# Patient Record
Sex: Female | Born: 1975 | Race: Black or African American | Hispanic: No | Marital: Married | Smoking: Never smoker
Health system: Southern US, Community
[De-identification: ages and names within clinical notes are randomized; demographics above are authoritative.]

## PROBLEM LIST (undated history)

## (undated) ENCOUNTER — Inpatient Hospital Stay (HOSPITAL_COMMUNITY): Payer: Self-pay

## (undated) DIAGNOSIS — D219 Benign neoplasm of connective and other soft tissue, unspecified: Secondary | ICD-10-CM

## (undated) HISTORY — PX: CHOLECYSTECTOMY: SHX55

## (undated) HISTORY — PX: LAPAROSCOPY: SHX197

## (undated) HISTORY — PX: OTHER SURGICAL HISTORY: SHX169

---

## 2000-06-16 ENCOUNTER — Other Ambulatory Visit: Admission: RE | Admit: 2000-06-16 | Discharge: 2000-06-16 | Payer: Self-pay | Admitting: *Deleted

## 2000-12-28 ENCOUNTER — Inpatient Hospital Stay (HOSPITAL_COMMUNITY): Admission: AD | Admit: 2000-12-28 | Discharge: 2000-12-31 | Payer: Self-pay | Admitting: Obstetrics and Gynecology

## 2001-01-01 ENCOUNTER — Encounter: Admission: RE | Admit: 2001-01-01 | Discharge: 2001-01-31 | Payer: Self-pay | Admitting: Obstetrics and Gynecology

## 2004-06-15 ENCOUNTER — Emergency Department (HOSPITAL_COMMUNITY): Admission: EM | Admit: 2004-06-15 | Discharge: 2004-06-16 | Payer: Self-pay | Admitting: Emergency Medicine

## 2004-06-24 ENCOUNTER — Ambulatory Visit (HOSPITAL_COMMUNITY): Admission: RE | Admit: 2004-06-24 | Discharge: 2004-06-24 | Payer: Self-pay

## 2004-06-24 ENCOUNTER — Encounter (INDEPENDENT_AMBULATORY_CARE_PROVIDER_SITE_OTHER): Payer: Self-pay | Admitting: Specialist

## 2004-06-30 ENCOUNTER — Inpatient Hospital Stay (HOSPITAL_COMMUNITY): Admission: EM | Admit: 2004-06-30 | Discharge: 2004-07-07 | Payer: Self-pay | Admitting: Emergency Medicine

## 2004-07-08 ENCOUNTER — Inpatient Hospital Stay (HOSPITAL_COMMUNITY): Admission: EM | Admit: 2004-07-08 | Discharge: 2004-07-11 | Payer: Self-pay | Admitting: Emergency Medicine

## 2004-08-06 ENCOUNTER — Ambulatory Visit (HOSPITAL_COMMUNITY): Admission: RE | Admit: 2004-08-06 | Discharge: 2004-08-06 | Payer: Self-pay | Admitting: Gastroenterology

## 2005-02-11 ENCOUNTER — Other Ambulatory Visit: Admission: RE | Admit: 2005-02-11 | Discharge: 2005-02-11 | Payer: Self-pay | Admitting: Obstetrics and Gynecology

## 2005-02-15 IMAGING — CT CT ABDOMEN W/ CM
1 of 3 series · 14 of 32 positions shown, 19 images · IV contrast (omnipaque)
Comparison: 07/04/2004 and 07/08/2004

ABDOMEN CT WITH CONTRAST

CLINICAL DATA: History of cholecystectomy with subsequent biloma. Status post
drainage. Drainage catheter has decreased output.
TECHNIQUE: Multidetector CT imaging of the abdomen and pelvis was performed
following the standard protocol during bolus administration of intravenous
contrast.

Contrast:  100 cc Omnipaque 300

[Series 5: — · axial · 0.57mm/px · z∈[-620,-216]mm · 14 of 91 slices shown, 19 images]
[im 5/91  soft-tissue]
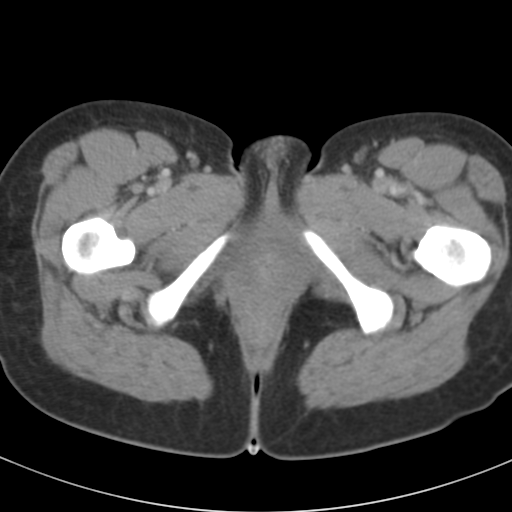
[im 5/91  bone]
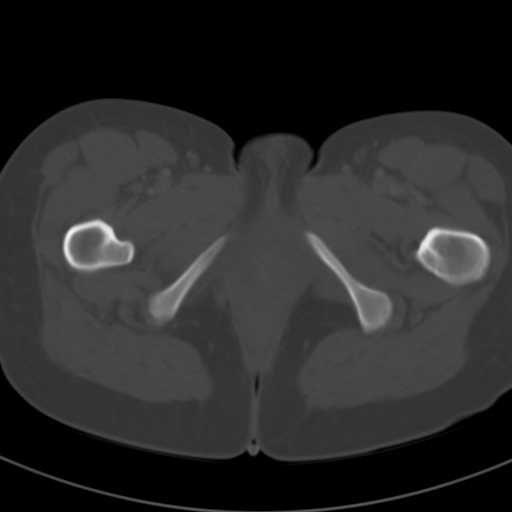
[im 14/91  soft-tissue]
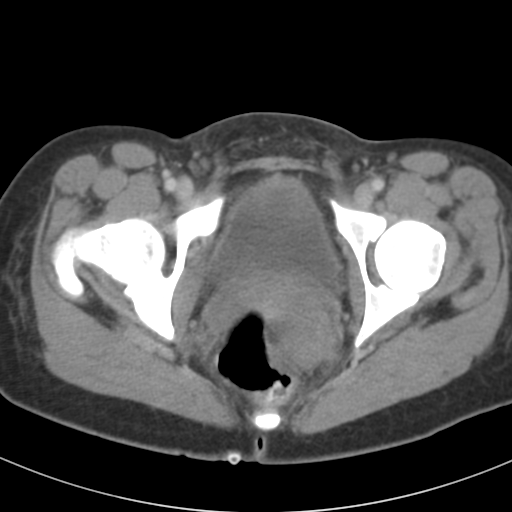
[im 19/91  soft-tissue]
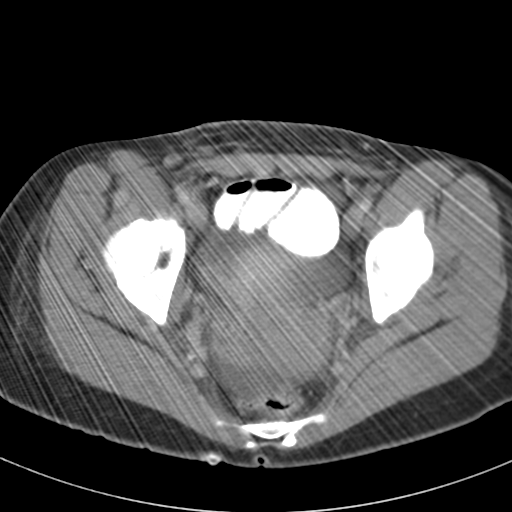
[im 28/91  soft-tissue]
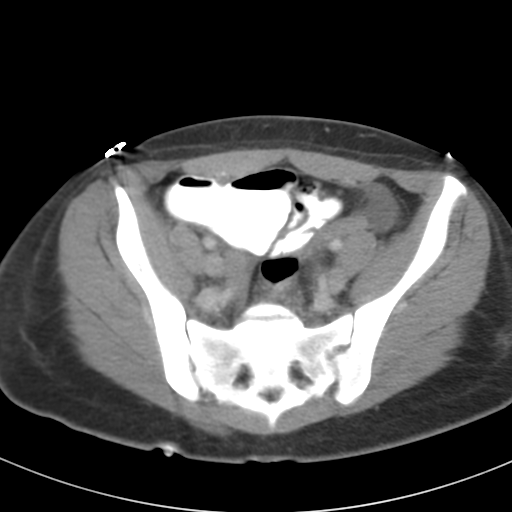
[im 32/91  soft-tissue]
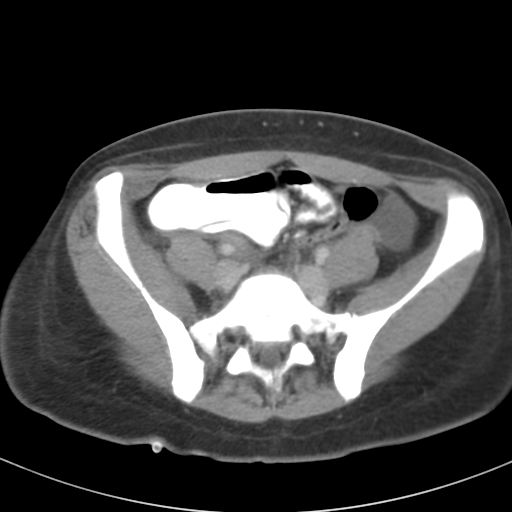
[im 41/91  soft-tissue]
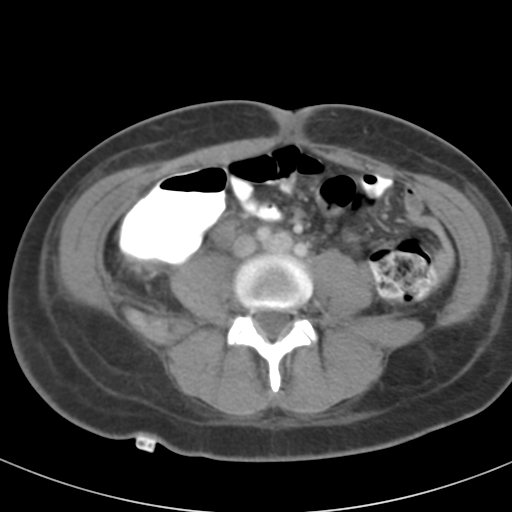
[im 46/91  soft-tissue]
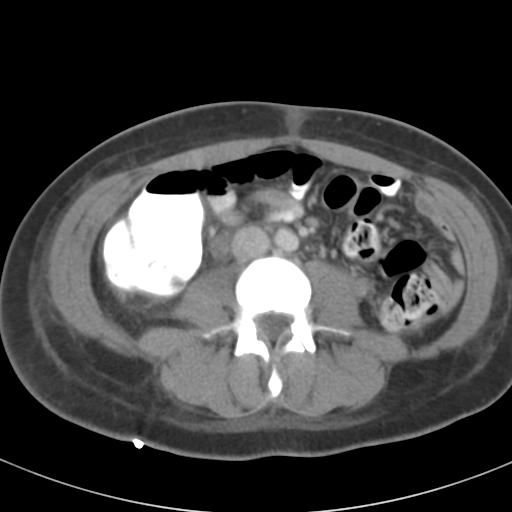
[im 50/91  soft-tissue]
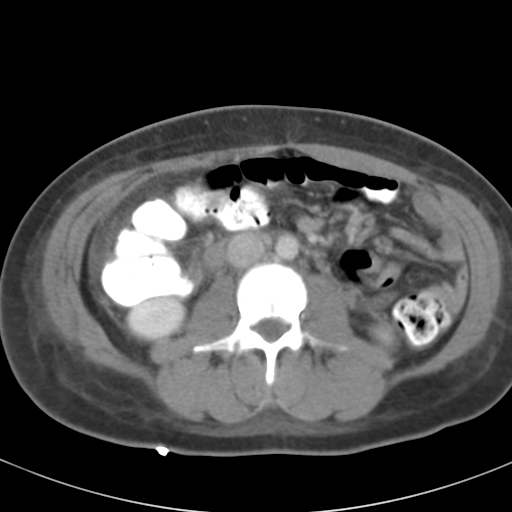
[im 59/91  soft-tissue]
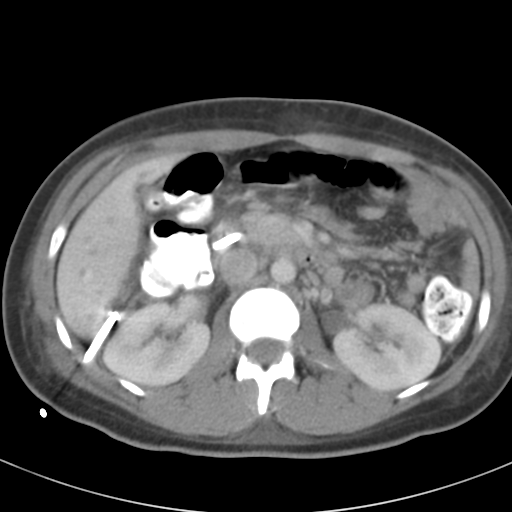
[im 59/91  bone]
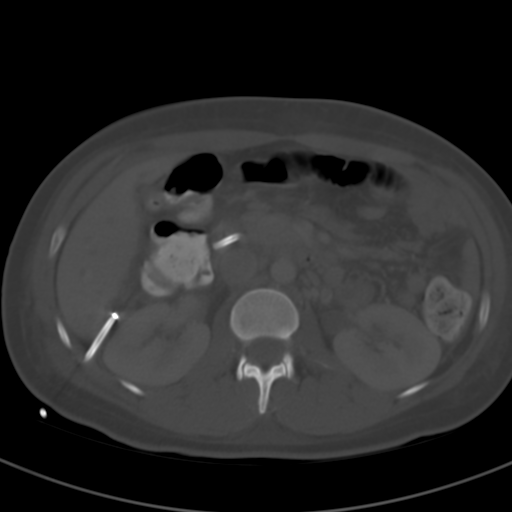
[im 64/91  soft-tissue]
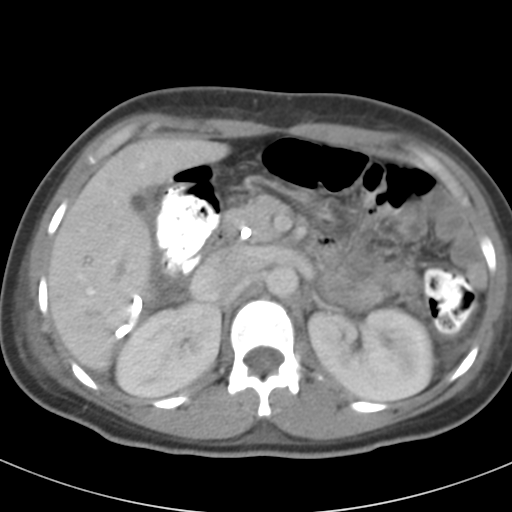
[im 73/91  soft-tissue]
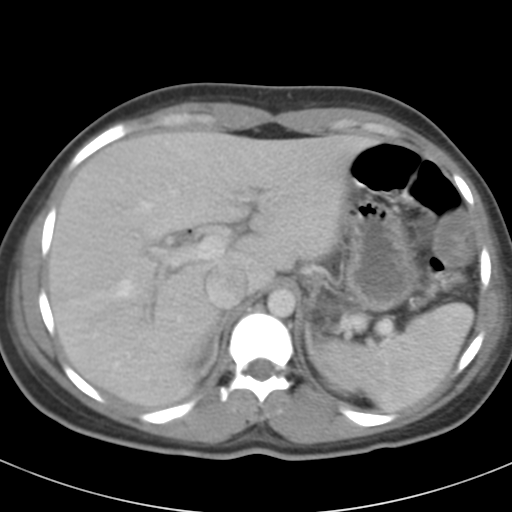
[im 73/91  lung]
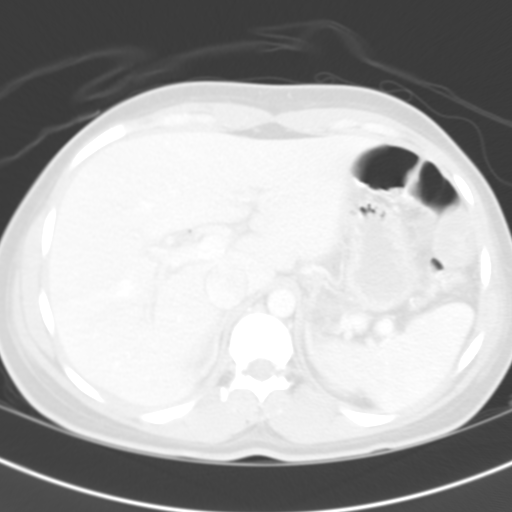
[im 77/91  soft-tissue]
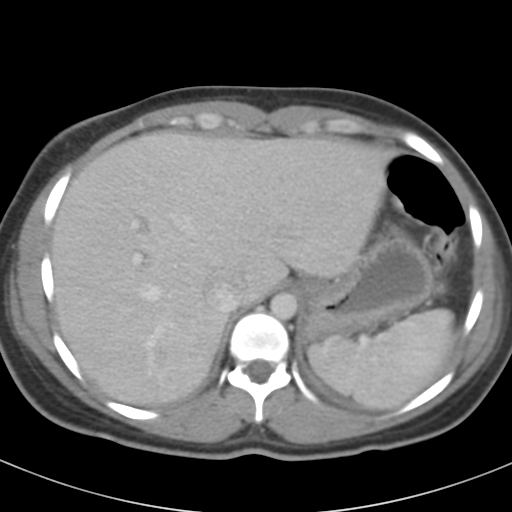
[im 77/91  lung]
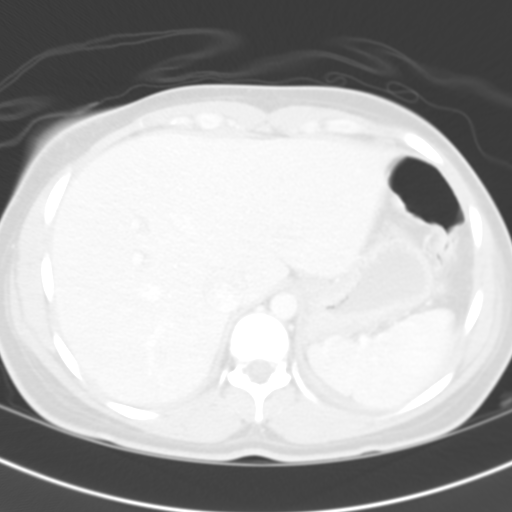
[im 82/91  lung]
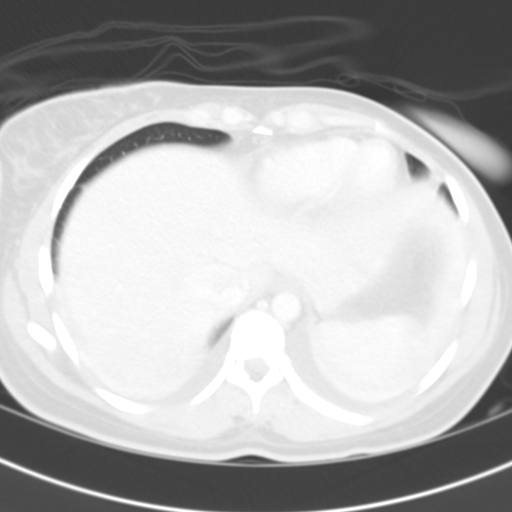
[im 86/91  soft-tissue]
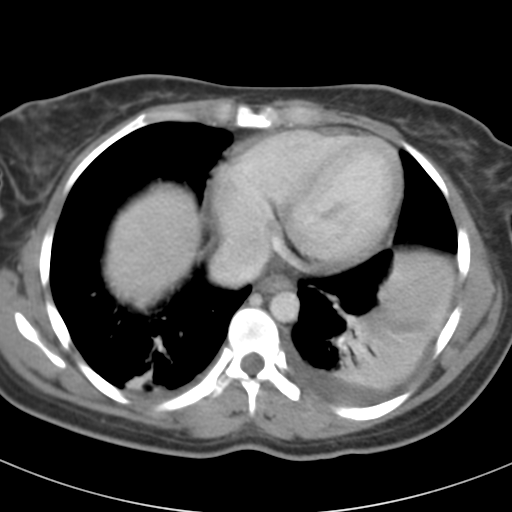
[im 86/91  lung]
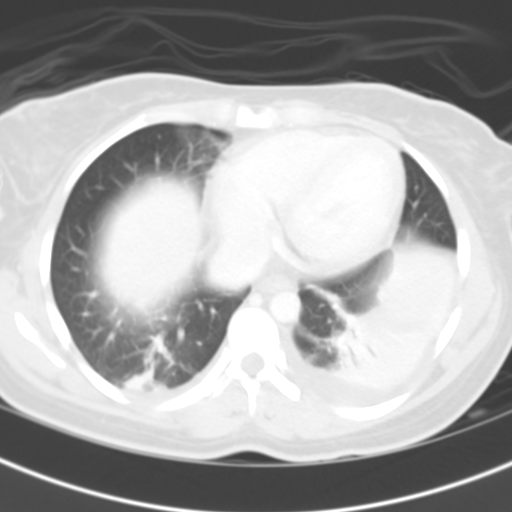

[14 of 32 positions shown; findings below may reference images not displayed]

FINDINGS: Right upper quadrant drain remains in place. Minimal fluid is present
around the drain currently. The fluid in the gallbladder fossa and Morrison's
pouch has decreased slightly since prior study.

There is mild periportal edema within the liver. Endoscopically placed common
bile duct stent again noted. There are small bilateral effusions with bibasilar
atelectasis. Solid organs are unremarkable.

IMPRESSION

Continued decrease in fluid within the right upper moderate. Very little fluid
remains around the right upper quadrant drain currently. .

PELVIS CT WITH CONTRAST
FINDINGS: Small amount of free fluid is noted in the pelvis, decreased slightly
since prior study. Uterus and adnexa unremarkable. Bowel grossly unremarkable.

IMPRESSION

Small amount of free fluid, decreased since prior study.

## 2005-02-17 ENCOUNTER — Inpatient Hospital Stay (HOSPITAL_COMMUNITY): Admission: AD | Admit: 2005-02-17 | Discharge: 2005-02-17 | Payer: Self-pay | Admitting: Obstetrics and Gynecology

## 2005-07-17 ENCOUNTER — Encounter (INDEPENDENT_AMBULATORY_CARE_PROVIDER_SITE_OTHER): Payer: Self-pay | Admitting: Specialist

## 2005-07-17 ENCOUNTER — Inpatient Hospital Stay (HOSPITAL_COMMUNITY): Admission: RE | Admit: 2005-07-17 | Discharge: 2005-07-20 | Payer: Self-pay | Admitting: Obstetrics and Gynecology

## 2005-11-28 ENCOUNTER — Emergency Department (HOSPITAL_COMMUNITY): Admission: EM | Admit: 2005-11-28 | Discharge: 2005-11-28 | Payer: Self-pay | Admitting: Family Medicine

## 2007-03-05 ENCOUNTER — Emergency Department (HOSPITAL_COMMUNITY): Admission: EM | Admit: 2007-03-05 | Discharge: 2007-03-05 | Payer: Self-pay | Admitting: Emergency Medicine

## 2010-12-06 NOTE — Op Note (Signed)
NAME:  Karina Long, Karina Long NO.:  [id number]   MEDICAL RECORD NO.:  [id number]          PATIENT TYPE:  INP   LOCATION:  0444                         FACILITY:  [facility]   PHYSICIAN:  Jordan Hawks. Elnoria Howard, MD    DATE OF BIRTH:  01-10-1976   DATE OF PROCEDURE:  07/01/2004  DATE OF DISCHARGE:                                 OPERATIVE REPORT   REFERRING PHYSICIAN:  Dr. Avel Peace   PROCEDURE:  ERCP.   INDICATION:  For biliary leak.   CONSENT:  Informed consent was obtained from the patient describing the  risks of bleeding, infection, perforation, medication reactions, a 7% chance  for pancreatitis, and the risk of death, all of which are not exclusive of  any other complications that may occur.   PHYSICAL EXAMINATION:  CARDIAC:  Regular rate, rhythm.  LUNGS:  Clear to auscultation bilaterally.  ABDOMEN:  Soft, flat, with right upper quadrant tenderness.   MEDICATIONS:  1.  Versed 5 mg.  2.  Demerol 60 mg.   DESCRIPTION OF PROCEDURE:  After adequate sedation was achieved while the  patient was on the fluoroscopy table, the duodenoscope was advanced from the  oral cavity to the second portion of the duodenum without difficulty under  direct visualization.  After proper positioning of the endoscope, the  papilla was visualized.  There was some evidence of some blood around the  papilla which was very minor, and this was not secondary to trauma.  After  adequate visualization was obtained, a Wilson-Cook triple-lumen  sphincterotome was advanced through the therapeutic channel and was inserted  into the papilla without difficulty.  No contrast was injected into the  pancreatic duct during this procedure.  Once the sphincterotome intubated  the biliary tract, a Jag-wire was inserted which was then advanced to the  left intrahepatic biliary system.  At this time, contrast was injected into  the biliary tract initially distally which was negative for any cystic duct  leak,  and subsequently the sphincterotome was advanced to the bifurcation of  the biliary tree, and additional injection of contrast was performed, and  there was no evidence of any leakage in the gallbladder fossa or through any  accessory ducts.  A total of 30 mL of contrast was used.  Fluoroscopic  images were obtained to confirm this finding.  It was at this time, the  decision was made to perform a biliary stent placement.  The sphincterotome  was exchanged over a guidewire, and a 7 [place] 12 cm biliary stent was  placed over the guidewire and advanced without difficulty.  Once the  guidewire was in place and confirmed fluoroscopically, the guidewire as well  as the endoscope was removed from the patient, and fluoroscopic images were  obtained.  One final note.  There is no evidence of any purulence draining  from the biliary tract after the stent was placed.   PLAN:  1.  Follow clinically.  2.  Remove the stent in 1 month.  3.  Follow up in GI clinic in 1 month's time.      PDH/MEDQ  D:  07/01/2004  T:  07/01/2004  Job:  [id number]   cc:   Adolph Pollack, M.D.  [address]  [place]  [place] [address]  Fax: [phone]   Lorre Munroe., M.D.  Fax: [phone]

## 2010-12-06 NOTE — Discharge Summary (Signed)
NAME:  JOE, TANNEY NO.:  [id number]   MEDICAL RECORD NO.:  [id number]          PATIENT TYPE:  INP   LOCATION:  9108                          FACILITY:  [facility]   PHYSICIAN:  Crist Fat. Rivard, M.D. DATE OF BIRTH:  1976-05-12   DATE OF ADMISSION:  07/17/2005  DATE OF DISCHARGE:  07/20/2005                                 DISCHARGE SUMMARY   ADMITTING DIAGNOSIS:  Intrauterine pregnancy at term with prior C-section,  desires repeat, desires sterilization.   PROCEDURE:  Repeat low transverse cesarean section and bilateral tubal  sterilization.   DISCHARGE DIAGNOSIS:  Intrauterine pregnancy at term delivered, a prior C-  section, desires sterilization, repeat low transverse cesarean section and  bilateral tubal sterilization.   HISTORY OF PRESENT ILLNESS:  Mrs. [name] is a 35 year old gravida 4, para 2-0-  1-2 who presented at term for repeat cesarean delivery and bilateral tubal  sterilization. This was accomplished on 07/17/2005 with surgeon Dr. Marline Backbone. The patient gave birth to a 6 pound 12 ounce female infant named  Nalaya with Apgar scores of 9 at 1 minutes and 9 at 5 minutes. The patient  and infant have done well in the postoperative period. The patient's  hemoglobin on the first postoperative day was 9.1. Her vital signs have been  stable. She is afebrile. Her incision is clean, dry and intact. The baby is  breast-feeding well and on this her third postoperative day, she is judged  to be in satisfactory condition for discharge.   DISCHARGE INSTRUCTIONS:  Per [facility] handout.   DISCHARGE MEDICATIONS:  1.  Motrin 600 milligrams p.o. q.6 h p.r.n. pain  2.  Tylox one to two p.o. q.3-4 hours p.r.n. pain  3.  Prenatal vitamins.   DISCHARGE FOLLOW-UP:  Will be a [facility] in 6 weeks.      Rica Koyanagi, C.N.M.      Crist Fat Rivard, M.D.  Electronically Signed    SDM/MEDQ  D:  07/20/2005  T:  07/21/2005  Job:  [id number]

## 2010-12-06 NOTE — Op Note (Signed)
[facility]  Patient:    Karina Long                        MRN: [id number] Proc. Date: 12/28/00 Adm. Date:  [id number] Attending:  Leonard Schwartz CC:         Juluis Mire, M.D.   Operative Report  PREOPERATIVE DIAGNOSES:       1. Intrauterine pregnancy.                               2. Cord prolapse.  POSTOPERATIVE DIAGNOSES:      1. Intrauterine pregnancy.                               2. Cord prolapse.  OPERATION/PROCEDURE:          Emergency primary low transverse cesarean section.  SURGEON:                      Janine Limbo, M.D.  ASSISTANT:                    Juluis Mire, M.D.  ANESTHESIA:                   Epidural.  INDICATIONS FOR PROCEDURE:    [name] is a 35 year old female, gravida 3 para 1, 0-1-1, who presented to labor and delivery at 39-1/[redacted] weeks gestation (EDC is December 31, 2000).  The patients cervix dilated to 8 cm.  The head was at -1 station.  The patient then had a spontaneous cord prolapse.  The patient was taken to the operating room for emergency cesarean section.  FINDINGS:                     A 6 pound 15 ounce female infant Allyne Gee) was delivered without difficulty.  The Apgars were nine at one minute and nine at five minutes.  The uterus, fallopian tubes, and ovaries were normal for the gravid state.  There was a cord prolapse present.  DESCRIPTION OF PROCEDURE:     The patient was taken to the operating room for an emergency cesarean section.  The patients abdomen was prepped with Betadine, as was the perineum.  A Foley catheter was placed in the bladder. The patient was sterilely draped.  A low transverse incision was made in the abdomen and the incision was carried sharply and bluntly through the fascia, the abdominal musculature, and the anterior peritoneum.  An incision was made in the lower uterine segment and the incision extended transversely.  The fetal head was delivered without  difficulty and the mouth and nose were suctioned.  The remainder of the infant was delivered.  The cord was clamped and cut.  The infant was handed to the pediatric team.  Routine cord blood studies were obtained.  The placenta was removed.  The uterine cavity was cleaned of amniotic fluid, clotted blood, and membranes.  The uterine incision was closed using a running suture of 2-0 Vicryl, followed by figure-of-eight sutures of 2-0 Vicryl for hemostasis.  The pericolonic gutters were cleaned of amniotic fluid and clotted blood.  Hemostasis was adequate.  The abdominal musculature and the anterior peritoneum were reapproximated in the midline using 2-0 Vicryl.  The fascia was closed using a running suture of  0 Vicryl followed by three interrupted sutures of 0 Vicryl.  The subcutaneous layer was irrigated.  Hemostasis was adequate.  The subcutaneous layer was closed using 2-0 Vicryl and the skin was reapproximated using skin staples.  Sponge, needle, and instrument counts were correct on two occasions.  The estimated blood loss was 700 cc.  The patient tolerated her procedure well.  The infant was taken to the full term nursery in stable condition.  The patient was taken to the recovery room in stable condition. DD:  12/28/00 TD:  12/28/00 Job: [id number]

## 2010-12-06 NOTE — Op Note (Signed)
NAME:  Karina Long, Karina Long NO.:  [id number]   MEDICAL RECORD NO.:  [id number]          PATIENT TYPE:  AMB   LOCATION:  DAY                          FACILITY:  [facility]   PHYSICIAN:  Lorre Munroe., M.D.DATE OF BIRTH:  07/05/1976   DATE OF PROCEDURE:  06/24/2004  DATE OF DISCHARGE:                                 OPERATIVE REPORT   PREOPERATIVE DIAGNOSIS:  Symptomatic gallstones.   POSTOPERATIVE DIAGNOSIS:  Symptomatic gallstones.   OPERATION:  Laparoscopic cholecystectomy with operative cholangiogram.   SURGEON:  Lebron Conners, M.D.   ANESTHESIA:  General.   PROCEDURE:  After the patient was monitored and anesthetized and had routine  preparation and draping of the abdomen, I infiltrated a local anesthetic  just below the umbilicus and made a 1.5 cm transverse incision.  I dissected  down to the fascia and opened it in midline for about 1.5 cm, then bluntly  entered the peritoneal cavity.  I placed a 0 Vicryl purse-string suture in  the fascia and secured a Hasson cannula, then inflated the abdomen with CO2.  I examined the contents.  Except for a mildly inflamed gallbladder, no other  abnormalities were seen.  I placed three additional ports under direct  vision through anesthetized sites and then retracted the fundus of the  gallbladder towards the right shoulder and pulled the infundibulum  laterally.  With the patient positioned head-up, foot-down, and tilted to  the left, I dissected the hepatoduodenal ligament until I clearly saw the  cystic duct emerging from the infundibulum of the gallbladder.  I clipped it  at its emergence from the infundibulum and made a small hole in it and  placed a cholangiogram catheter with some difficulty, but then I was able to  obtain a good fluoroscopic cholangiogram showing normal biliary anatomy with  normal-sized ducts, free flow into the duodenum, and no evidence of filling  defects.  I then removed the cholangiogram  catheter after repositioning the  patient, and I clipped the distal cystic duct with three clips and divided  it.  I dissected out the cystic artery and clipped it with three clips and  cut it between the two clips closest to the gallbladder.  I then used the  cautery with the hook instrument to dissect the gallbladder from the fossa  and got hemostasis with the cautery.  I then briefly irrigated the area and  removed small blood clots and removed irrigant.  I removed the gallbladder  from the abdomen through the umbilical incision.  It was necessary to  enlarge the incision slightly to get it out because of the bulk of the  gallstones.  I then tied the purse-string suture and used one additional 0  Vicryl stitch to securely close the umbilical incision. After allowing the  CO2 to escape, I removed the other ports and closed all of the skin  incisions with intracuticular 4-0 Vicryl and Steri-Strips.  She tolerated  the operation well.     Will   WB/MEDQ  D:  06/24/2004  T:  06/24/2004  Job:  [id number]

## 2010-12-06 NOTE — Discharge Summary (Signed)
NAME:  Karina Long, Karina Long NO.:  [id number]   MEDICAL RECORD NO.:  [id number]          PATIENT TYPE:  INP   LOCATION:  0371                         FACILITY:  [facility]   PHYSICIAN:  Lorre Munroe., M.D.DATE OF BIRTH:  Jul 25, 1975   DATE OF ADMISSION:  07/08/2004  DATE OF DISCHARGE:  07/11/2004                                 DISCHARGE SUMMARY   HISTORY:  The patient is a 35 year old black female who had a laparoscopic  cholecystectomy complicated by biloma and then stent placement complicated  by pancreatitis.  She went home the day before admission but had to come  back because of increased pain.  It was felt when admitted by Dr. Luan Pulling  that she had either pancreatitis or abscess.  She was admitted, labs  obtained, and CT done.   HOSPITAL COURSE:  The patient felt better right away with pain medicine.  She had a lot of back pain.  She had fever.  White count was 20,000.  The CT  showed no abscess.  There was some pneumobilia.  Percutaneous drain was in  good position.  I felt she might have cholangitis, and we continued Primaxin  that she was started on.  Dr. Elnoria Howard saw her.  We continued supportive care.  I did not think that the percutaneous drain was draining very well, so I  asked interventional radiology to do a CT and to perhaps exchange the  biliary catheter.  Good drain was placed, and the patient felt much better,  with decrease in drainage, decrease in fever and white count.  I felt she  was responding to treatment.  By July 11, 2004, she was afebrile, felt  well, had no back pain, abdomen soft, and white count was 12,200.  The  culture grew gram positive cocci and gram negative rods.  She was sent home  with a drain in and on Augmentin and arrangements made for her to return to  the office in a week.   DIAGNOSIS:  Abscess in postoperative biloma cavity.   PROCEDURE:  Percutaneous drainage.   DISCHARGE CONDITION:  Improved.     Will   WB/MEDQ  D:  08/02/2004  T:  08/02/2004  Job:  [id number]

## 2010-12-06 NOTE — Discharge Summary (Signed)
NAME:  Karina Long, Karina Long NO.:  [id number]   MEDICAL RECORD NO.:  [id number]          PATIENT TYPE:  INP   LOCATION:  0444                         FACILITY:  [facility]   PHYSICIAN:  Lorre Munroe., M.D.DATE OF BIRTH:  [birth year]   DATE OF ADMISSION:  06/30/2004  DATE OF DISCHARGE:  07/07/2004                                 DISCHARGE SUMMARY   HISTORY:  The patient is a 35 year old female who underwent what appeared to  be an uneventful laparoscopic cholecystectomy on June 24, 2004 but  negative operative cholangiogram.  She developed severe pain on the day of  admission and ws admitted by Dr. Abbey Chatters because of abnormal liver tests  and suspicion of a bile leak.  See his History and Physical for further  details.  There was mild abdominal tenderness.   HOSPITAL COURSE:  The patient was admitted and Dr. Elnoria Howard was consulted.  She  did have a hepatobiliary scan demonstrating biliary leak.  Dr. Elnoria Howard placed a  biliary stent.  The patient got increased amylase thereafter and increased  lipase and had obvious pancreatitis.  The pancreatitis never became severe.  She did have a fair amount of pain.  She gradually improved and was able to  start eating.  By December 18 she was afebrile, comfortable, white count  normal, hemoglobin stable, belly soft, and the drainage from a percutaneous  drain was mild.  The patient felt she could handle this well and she was  discharged and arrangements made for follow-up in the office.   DIAGNOSIS:  1.  Postoperative biliary leak producing biloma.  2.  Pancreatitis following endoscopic retrograde cholangiopancreatography.   PROCEDURES:  1.  Endoscopic retrograde cholangiopancreatography with stent placement.  2.  Drainage of biloma.   DISCHARGE CONDITION:  Improved.     Will   WB/MEDQ  D:  08/02/2004  T:  08/02/2004  Job:  [id number]

## 2010-12-06 NOTE — H&P (Signed)
NAME:  Karina Long, Karina Long NO.:  [id number]   MEDICAL RECORD NO.:  [id number]          PATIENT TYPE:  INP   LOCATION:  0101                         FACILITY:  [facility]   PHYSICIAN:  Adolph Pollack, M.D.DATE OF BIRTH:  12-04-1975   DATE OF ADMISSION:  06/30/2004  DATE OF DISCHARGE:                                HISTORY & PHYSICAL   CHIEF COMPLAINT:  Right upper quadrant pain, post laparoscopic  cholecystectomy.   HISTORY OF PRESENT ILLNESS:  This is a 35 year old female, who underwent  what appeared to be an uneventful laparoscopic cholecystectomy, June 24, 2004, with a negative intraoperative cholangiogram.  She was doing well  until today about 10:00 when she had the abrupt onset of spasmodic, crampy-  type pain in the right upper quadrant epigastrium, radiating around to the  right subscapular region that persisted and worsened.  I spoke with her  fiancee over the phone and asked her to come to the emergency department  which she has done.  She tried taking some Vicodin, but this did not help  the pain.  She denies any trauma to the area.  She has not eaten much of  anything today.  She had some nausea but no fever, chills, or dark urine.   PAST MEDICAL HISTORY:  No chronic illnesses.   PREVIOUS OPERATIONS:  1.  Cesarean section.  2.  Laparoscopic cholecystectomy.   ALLERGIES:  None.   MEDICATIONS:  Vicodin p.r.n.   SOCIAL HISTORY:  She is a single mother.  Her fiancee is with her.  No  tobacco or alcohol use.   REVIEW OF SYSTEMS:  Denies chest pain, shortness of breath.  No dysuria.  Had been doing well up to this point.   PHYSICAL EXAMINATION:  GENERAL:  A thin, uncomfortable-appearing female.  VITAL SIGNS:  Temperature is 98.3, blood pressure 117/76, pulse 86.  EYES:  Extraocular movements intact.  No icterus.  NECK:  Supple without palpable masses.  RESPIRATORY:  Breath sounds equal and clear.  Respirations unlabored.  CARDIOVASCULAR:  Regular  rate and rhythm.  No murmur heard.  ABDOMEN:  Soft with right upper quadrant epigastric tenderness to palpation.  No significant guarding.  The incisions are clean and intact.  EXTREMITIES:  Some dry skin in the lower extremities but otherwise good  range of motion.   LABORATORY DATA:  White count is 8400 with a very slight leftward shift.  Hemoglobin 13.9.  SGOT is 52, SGPT 76, and alkaline phosphatase is 122, all  of which are mildly elevated.  Total bilirubin is normal.  Amylase/lipase  normal.   The intraoperative cholangiogram report, June 24, 2004, was reviewed and  demonstrated no common bile duct stones.   IMPRESSION:  Post laparoscopic cholecystectomy and right upper quadrant pain  - This presentation is suspicious for a bile leak, likely from an accessory  duct.  This versus a retained stone or a small bowel injury.  I would expect  that a small bowel injury would present much earlier.   PLAN:  1.  Admit to the hospital.  2.  Start empiric IV  antibiotics.  IV fluid hydration, analgesia.  3.  We will get a STAT HIDA scan and if this does not show a leak, we will      then check an ultrasound to make sure we do have the retained stone. If      she does have a bile leak then will request GI consultation for ERCP and      stent placement. I have discussed this with her and her fiancee.     Tish Men  D:  06/30/2004  T:  06/30/2004  Job:  [id number]

## 2010-12-06 NOTE — H&P (Signed)
[facility]  Patient:    Karina Long                        MRN: [id number] Adm. Date:  12/28/00 Attending:  Janine Limbo, M.D. Dictator:   Wynelle Bourgeois, CNM                         History and Physical  BRIEF HISTORY:  [name] is a 35 year old G3, para 1-0-1-1 at 57 and 4/7 weeks who presents with complaints of regular uterine contractions x 2 hours. She denies any leaking or bleeding and reports loss of fetal movement.  Her pregnancy has been followed by the [facility] at [facility] and has been remarkable for:  1. A history of preterm labor with delivery at 36 weeks. 2. Group B streptococcus positive.  Her OB history is remarkable for vaginal delivery in May of 1998 of a female infant at 28 1/2 weeks weighing 7 pounds 10 ounces.  She has an elective AB at 3-1/2 months in February of 2001.  Her medical history is remarkable for a history of anemia, occasional yeast infections and childhood varicella.  FAMILY HISTORY:  Unremarkable.  GENETIC HISTORY:  Unremarkable.  SOCIAL HISTORY:  The patient is single but involved with Olga Millers who is involved and supportive.  Her mother is present with her at this time.  She is of a nondenominational faith.  She denies any alcohol, tobacco or drug use. Her prenatal labs are a hemoglobin 11.8, hematocrit 36.1, platelets 348, blood type 0 positive, antibody screen negative, sickle cell negative, RPR nonreactive.  Rubella immune.  HBsAg negative,  HIV nonreactive.  Pap smear test benign reactive changes.  Gonorrhea negative, chlamydia negative, glucose challenge within normal limits, group B strep positive.  PHYSICAL EXAMINATION:  VITAL SIGNS:  Vital signs stable, afebrile.  HEENT:  Within normal limits.  Thyroid normal, not enlarged.  BREASTS:  Soft, nontender, no masses.  CHEST: Clear to auscultation bilaterally.  HEART:  Regular rate and rhythm.  ABDOMEN:  Gravid at 38 cm.   Vertex leopolds.  EFM:  Electric fetal monitoring shows a reactive fetal heart rate tracing with positive accelerations and no decelerations.  She is having uterine contractions every 2-3 minutes.  Cervical exam is 6 cm, 90% effaced, minus 1 station with a bulging bag of waters with a vertex presentation.  EXTREMITIES:  Within normal limits.  ASSESSMENT: 1. Intrauterine pregnancy and 39 and 4/7 weeks. 2. Active labor. 3. Group B streptococcus positive.  PLAN: 1. Admit to birthing suites, Dr. Pennie Rushing notified. 2. Routine Certified Nurse-Midwife orders. 3. Epidural. 4. Anticipated spontaneous vaginal delivery. DD:  12/28/00 TD:  12/28/00 Job: [id number]

## 2010-12-06 NOTE — Op Note (Signed)
NAME:  Karina Long, Karina Long NO.:  [id number]   MEDICAL RECORD NO.:  [id number]          PATIENT TYPE:  AMB   LOCATION:  ENDO                         FACILITY:  [facility]   PHYSICIAN:  Jordan Hawks. Elnoria Howard, MD    DATE OF BIRTH:  July 04, 1976   DATE OF PROCEDURE:  07/09/2004  DATE OF DISCHARGE:                                 OPERATIVE REPORT   REFERRING PHYSICIAN:  Dr. Lebron Conners   PROCEDURE:  ERCP.   INDICATION:  Cholestasis.   CONSENT:  Informed consent was obtained from the patient, describing the  risks of bleeding, infection, perforation, medication reactions, a 7% risk  of pancreatitis, although it was discussed in detail with the patient that  she is at a high risk as she did experience a minor episode of post-ERCP  pancreatitis and that she is a young female which is the highest risk for  this type of complication as well as the risk of death, all of which are not  exclusive of any other complications that may occur.  The patient  acknowledged understanding and desired to proceed with the procedure.   PHYSICAL EXAMINATION:  CARDIAC:  Regular rate, rhythm, tachycardic.  LUNGS:  Clear to auscultation bilaterally.  ABDOMEN:  Soft, nontender, nondistended.   MEDICATIONS:  1.  Versed 9 mg IV.  2.  Demerol 70 mg IV.   DESCRIPTION OF PROCEDURE:  After adequate sedation was achieved, the  duodenoscope was advanced from the oral cavity to the second portion of the  duodenum under direct visualization without difficulty.  After proper  positioning was obtained, the 7 French 12 cm stent was noted to be in good  position, and there was drainage of bile through this area.  At this time,  there was no gross evidence that it was clogged; however, in light of her  cholestasis, the decision was made to remove the stent.  A snare was  deployed, and the 7 [place] stent was removed without difficulty.  Subsequently, a Tri-Tome sphincterotome was used to intubate the CBD.  There  was no difficulty with this intubation.  The pancreatic duct was not  manipulated nor was it intubated.  After intubation of the CBD, a Jag-wire  was deployed and advanced into the right intrahepatic system.  Subsequently,  12 mL of 50% contrast was injected, and there was no evidence of any bile  leak.  This was confirmed with radiology on hand.  There was evidence that  there was some delayed opacification of the right intrahepatic system;  however, with continued injection of the contrast, the right intrahepatic  system did highlight with the contrast.  Fluoroscopic images were obtained.  Subsequently, the sphincterotome was exchanged over the guidewire, and a 10  French 5 cm stent was placed into the distal CBD without difficulty.  After  disarticulation of the guidewire and introducer, the stent was noted to be  in excellent position with good bile drainage.  Fluoroscopic imaging as well  as radio imaging was obtained of the stent placement, and no complications  were encountered.  The procedure was subsequently terminated.  The patient  tolerated the procedure well.   PLAN:  1.  Follow her serial liver panels as well as her white blood cell count.  2.  Continue antibiotics.  3.  Follow clinically.  4.  There is possible evidence that the biloma may be infected, and this      will need to be monitored closely.      PDH/MEDQ  D:  07/09/2004  T:  07/09/2004  Job:  [id number]   cc:   Lorre Munroe., M.D.  Fax: [phone]

## 2010-12-06 NOTE — Op Note (Signed)
NAME:  Karina Long, Karina Long NO.:  [id number]   MEDICAL RECORD NO.:  [id number]          PATIENT TYPE:  INP   LOCATION:  9108                          [facility]ACILITY:  [facility]   PHYSICIAN:  Janine Limbo, M.D.DATE OF BIRTH:  [birth year]   DATE OF PROCEDURE:  07/17/2005  DATE OF DISCHARGE:                                 OPERATIVE REPORT   PREOPERATIVE DIAGNOSES:  1.  Term intrauterine pregnancy.  2.  Prior cesarean section.  3.  Desires sterilization.   POSTOPERATIVE DIAGNOSES:  1.  Term intrauterine pregnancy.  2.  Prior cesarean section.  3.  Desires sterilization.   PROCEDURES:  1.  Repeat low transverse cesarean section.  2.  Bilateral tubal ligation.   SURGEON:  Janine Limbo, M.D.   FIRST ASSISTANT:  Renaldo Reel. Emilee Hero, C.N.M.   ANESTHETIC:  Spinal.   DISPOSITION:  Karina Long is a 35 year old female, gravida 4, para 2-0-1-2,  who presents with the above-mentioned diagnoses.  The patient understands  the indications for her procedure and she accepts the risk of, but not  limited to, anesthetic complications, bleeding, infections, possible damage  to surrounding organs, and possible tubal failure (17 per 1000).   FINDINGS:  A 6 pounds 12 ounce female infant (Nalaya) was delivered from a  cephalic presentation.  The Apgars were 9 at one minute and 9 at five  minutes.  The uterus, fallopian tubes, and the ovaries were normal for the  gravid state.   PROCEDURE:  The patient was taken to the operating room, where a spinal  anesthetic was given.  The patient's abdomen, perineum, and vagina were  prepped with multiple layers of Betadine.  A Foley catheter was placed in  the bladder.  The patient was sterilely draped.  The lower abdomen was  injected with 20 mL of 0.5% Marcaine with epinephrine.  An incision was made  in the lower abdomen, removing the previous scar.  The incision was extended  through the subcutaneous tissue, the fascia, and the anterior  peritoneum.  The bladder flap was developed.  An incision was made in the lower uterine  segment and extended transversely.  The fetal head was delivered without  difficulty.  The mouth and nose were suctioned.  The remainder of the infant  was then delivered.  The cord was clamped and cut and the infant was handed  to the waiting pediatric team.  Routine cord blood studies were obtained.  The placenta was removed.  The uterine cavity was cleaned of amniotic fluid,  clotted blood, and membranes.  The uterine incision was closed using a  running locking suture of 2-0 Vicryl, followed by imbricating suture of 2-0  Vicryl.  Hemostasis was adequate.  The left fallopian tube was identified  and followed out to its fimbriated end.  A knuckle of tube was made on the  left using a free tie and then a ligature of 0 plain catgut.  The knuckle of  tube thus made was excised.  Hemostasis was adequate.  An identical  procedure was carried out on the opposite side.  Again hemostasis  was  adequate.  The anterior peritoneum and abdominal musculature were closed  after vigorously irrigating the pelvis.  Vicryl 2-0 was used.  The fascia  was closed using a running suture of 0 Vicryl, followed by three interrupted  sutures of 0 Vicryl.  The subcutaneous layer was closed using a running  suture of 0 Vicryl.  The skin was closed using a subcuticular suture of 3-0  Monocryl.  The sponge, needle, and instrument counts were correct on two  occasions.  The estimated blood loss was 800 mL.  The patient tolerated her  procedure well.  She was taken to the recovery room in stable condition.  The infant was taken to the full-term nursery in stable condition.      Janine Limbo, M.D.  Electronically Signed     AVS/MEDQ  D:  07/17/2005  T:  07/17/2005  Job:  [id number]

## 2010-12-06 NOTE — Consult Note (Signed)
NAME:  Karina Long, RUMLER NO.:  [id number]   MEDICAL RECORD NO.:  [id number]          PATIENT TYPE:  INP   LOCATION:  0444                         FACILITY:  [facility]   PHYSICIAN:  Jordan Hawks. Elnoria Howard, MD    DATE OF BIRTH:  02/26/1976   DATE OF CONSULTATION:  06/30/2004  DATE OF DISCHARGE:                                   CONSULTATION   REFERRING PHYSICIAN:  Adolph Pollack, M.D.   REASON FOR CONSULTATION:  Biliary leak status post laparoscopic  cholecystectomy.   HISTORY OF PRESENT ILLNESS:  This is a 35 year old black female with a past  medical history of cholelithiasis status post laparoscopic cholecystectomy  on June 24, 2004.  Prior to this time, the patient was complaining of  right upper quadrant pain for approximately 8 months and upon presentation 1  week before the surgery, she was found to have gallstones.  Subsequently,  the patient was arranged to have a cholecystectomy which was performed on  06/24/04.  No complications were encountered during that time.  Intraoperative cholangiogram was negative.  The patient was discharged home  on a routine basis and she continued to do well until June 30, 2004.  The patient complained of acute right upper quadrant pain.  She denied any  fevers associated with this pain.  The pain is described as a crampy type of  pain in the right upper quadrant with radiation to the right subscapular  region.  Because of the worsening pain, the patient contacted Dr. Abbey Chatters  who subsequently recommended the patient be admitted for further evaluation.   PAST MEDICAL/SURGICAL HISTORY:  As stated above with the addition of  cesarean section.   ALLERGIES:  None.   MEDICATIONS:  Vicodin p.r.n.   SOCIAL HISTORY:  The patient is currently living with her fiance.  No  history of tobacco, alcohol, or illicit drug use.   REVIEW OF SYSTEMS:  Negative for any headaches, blurry vision, tinnitus,  vertigo, dysphagia, odynophagia,  dry mouth, shortness of breath, chest pain,  muscle aches or pains, constipation or diarrhea, or rashes.   PHYSICAL EXAMINATION:  VITAL SIGNS:  Blood pressure 117/76, heart rate 86,  temperature 98.3.  GENERAL:  The patient is in no acute distress.  Alert and oriented.  HEENT:  Normocephalic, atraumatic.  Extraocular muscles intact.  Pupils  equal, round, and reactive to light.  NECK:  Supple.  No lymphadenopathy.  LUNGS:  Clear to auscultation bilaterally.  CARDIOVASCULAR:  Regular rate and rhythm without murmurs, gallops, or rubs.  ABDOMEN:  Soft, tender in the right upper quadrant region.  No rebound or  rigidity.  Positive bowel sounds.  There is evidence of laparoscopic  cholecystectomy scars which are healing.  EXTREMITIES:  No clubbing, cyanosis, or edema.  Full range of motion.  NEUROLOGIC:  The patient is grossly intact.  SKIN:  Negative for any rashes or skin breakdown.   LABORATORY DATA:  On June 30, 2004, white blood cell count 8.4,  hemoglobin 13.9, platelets 377.  Sodium 138, potassium 4.1, chloride 104,  CO2 27, glucose 94, BUN 5, creatinine 0.7,  total bilirubin 0.7, alkaline  phosphatase 122, AST 52, ALT 76, total protein 7.5, albumin 3.8, calcium  9.5.  Amylase 65, lipase 120.  Urinalysis is negative for any infections.   RADIOLOGY:  Intraoperative cholangiogram on June 24, 2004, was negative  for any choledocholithiasis.  HIDA scan reveals a biliary leak.   IMPRESSION:  Bile leak status post laparoscopic cholecystectomy.  This can  be result of a leakage from an accessory duct of L[place]chka or leak from the  common bile duct stump.  The patient currently is stable at this time, is  afebrile, and does not demonstrate any active infection.   PLAN:  Perform an ERCP on July 01, 2004, with stent placement.  This  will help to divert the pressure flow through the common bile duct and allow  for sealing of any leakage in the biliary tract.  I have discussed with  the  patient the risks of the procedure which include but are not exclusive of  bleeding, infection, perforation, medication reactions, a 7% chance of  pancreatitis, and the risk of death.  The patient acknowledged understanding  of these risks and all questions were answered.      PDH/MEDQ  D:  06/30/2004  T:  07/01/2004  Job:  [id number]   cc:   Adolph Pollack, M.D.  [address]  [place]  [place] [address]  Fax: [phone]

## 2010-12-06 NOTE — H&P (Signed)
NAME:  Karina Long, Karina Long NO.:  [id number]   MEDICAL RECORD NO.:  [id number]          PATIENT TYPE:  INP   LOCATION:  NA                            FAC[facility]  [facility]   PHYSICIAN:  Janine Limbo, M.D.DATE OF BIRTH:  Dec 22, 1975   DATE OF ADMISSION:  07/17/2005  DATE OF DISCHARGE:                                HISTORY & PHYSICAL   HISTORY OF PRESENT ILLNESS:  [name] is a 35 year old female, gravida 4,  para 2-0-1-2 who presents at [redacted] weeks gestation (EDC is August 03, 2005).  The patient has been followed at the [facility] obstetrics and  gynecology division of [facility].  Her pregnancy has  largely been uncomplicated.  She has had a prior Cesarean section and she  desires a repeat Cesarean section.  She also desires permanent  sterilization.   OBSTETRICAL HISTORY:  In 1998 the patient had a vaginal delivery at term of  a 7 pound, 10 ounce female infant.  In 2002 the patient had a Cesarean section  at term because of a prolapsed cord.  She delivered a 6 pounds, 15 ounces  female infant.  In 2004 the patient had an elective pregnancy termination.   PAST MEDICAL HISTORY:  The patient denies hypertension, diabetes and other  medical illnesses.  She did have a cholecystectomy in December of 2005.   DRUG ALLERGIES:  No known drug allergies.   SOCIAL HISTORY:  The patient denies cigarettes use, alcohol use and  recreational drug use.   REVIEW OF SYSTEMS:  Normal pregnancy complaints.   FAMILY HISTORY:  Noncontributory.   PHYSICAL EXAMINATION:  VITAL SIGNS:  Height is 5 feet, 2 inches, weight is  143 pounds.  HEENT:  Within normal limits.  CHEST:  Clear.  HEART:  Regular rate and rhythm.  BREASTS:  Are without masses.  ABDOMEN:  Gravid with fundal height of 36 to 38 cm.  EXTREMITIES:  Are within normal limits.  NEUROLOGICAL:  Grossly normal.  PELVIC:  The cervix is 2 cm dilated, 50% effaced, minus 3 station.   LABORATORY DATA:  Blood  type is O positive, antibody screen negative, sickle  cell negative, VDRL nonreactive, Rubella positive, HBsAg negative, HIV  negative.  Alpha fetoprotein screen is within normal limits.  Her Glucola  screen is within normal limits.   ASSESSMENT:  1.  Term intrauterine pregnancy.  2.  Prior Cesarean section.  3.  Desires repeat Cesarean section and desires sterilization.   PLAN:  The patient will undergo a repeat low transverse Cesarean section and  bilateral tubal ligation.  The patient understands the indications for her  surgical procedure and she accepts the risks of, but not limited to,  anesthesia complications, bleeding, infections and possible damage to the  surrounding organs.  The patient understands there is a small but real  failure rate associated with her tubal ligation (17 per 1,000).      Janine Limbo, M.D.  Electronically Signed     AVS/MEDQ  D:  07/11/2005  T:  07/11/2005  Job:  [id number]

## 2010-12-06 NOTE — Discharge Summary (Signed)
[facility]  Patient:    Karina Long                        MRN: [id number] Adm. Date:  [id number] Disc. Date: [id number] Attending:  Leonard Schwartz Dictator:   Philipp Deputy, C.N.M.                           Discharge Summary  DATE OF BIRTH:                August 27, 1975  ADMISSION DIAGNOSES:          Intrauterine pregnancy at term, cord prolapse.  DISCHARGE DIAGNOSES:          Cord prolapse.  PROCEDURE:                    Primary low transverse cesarean section with epidural as anesthesia.  HOSPITAL COURSE:              [name] is a 36 year old gravida 3, para 1-0-1-1 at 57 4/7 weeks who was admitted for regular uterine contractions x 2 hours and in active labor.  Pregnancy has been remarkable for history of preterm delivery at 36 weeks, group B strep positive.  Patient received an epidural and was comfortable.  At approximately 7:45 a.m. on December 28, 2000 her cervix was 8 cm dilated.  At about 7:52 her membranes spontaneously ruptured. At that point there was bradycardia, fetal heart rate in the 100s and cervical examination revealed a prolapsed cord for which she was immediately prepped for a cesarean section and taken to the operating room with Dr. Stefano Gaul as the surgeon.  Patient tolerated the procedure well with delivery of a viable female infant with the name Karina Long.  Apgars were 9 and 9 and weight was 6 pounds 15 ounces.  Infant was taken to the full-term nursery and mom was taken to recovery in good condition.  By postoperative day #1 patient was doing well. Physical examination was within normal limits.  Incision was clean, dry, and intact.  Patients hemoglobin was 9.4 and it had been 12.3 upon admission. She was tolerating a regular diet.  The rest of the postoperative stay was unremarkable.  She was breast-feeding and planned Micronor for contraception. By postoperative day #3 she was deemed to have received the benefit of  her hospital stay and was discharged home.  DISCHARGE INSTRUCTIONS:       Per [facility] handout.  DISCHARGE MEDICATIONS:        1. Motrin 600 mg p.o. q.6h. p.r.n.                               2. Tylox one to two p.o. q.3-4h. p.r.n.                               3. Micronor one p.o. q.d. to start January 17, 2001.  DISCHARGE FOLLOW-UP:          Six weeks at [facility]. DD:  12/31/00 TD:  12/31/00 Job: [id number]

## 2010-12-06 NOTE — Consult Note (Signed)
NAME:  Karina Long, SHIPPEE NO.:  [id number]   MEDICAL RECORD NO.:  [id number]          PATIENT TYPE:  AMB   LOCATION:  ENDO                         FACILITY:  [facility]   PHYSICIAN:  Jordan Hawks. Elnoria Howard, MD    DATE OF BIRTH:  12/31/1975   DATE OF CONSULTATION:  07/09/2004  DATE OF DISCHARGE:                                   CONSULTATION   REFERRING PHYSICIAN:  Dr. Lebron Conners   REASON FOR CONSULTATION:  Abdominal pain and history of bile leak.   HISTORY OF PRESENT ILLNESS:  This is a 35 year old black female, who is well-  known to me, re-presents to the hospital with complaints of severe back  pain.  The patient was initially admitted approximately 1 week prior with  evidence of a bile leak.  The patient subsequently underwent an ERCP with  stent placement which was successful.  It was felt that the patient may have  had a very mild post-ERCP pancreatitis, as she was complaining of back pain;  however, in addition to the back pain, the patient did develop a fever to  101-102 approximately 1-2 days after the ERCP.  CT scan at that time on  July 04, 2004, revealed that she had a large biloma.  Subsequently, the  patient had a percutaneous stent placed in by radiology which drained the  biloma.  The patient clinically defervesced and improved and was doing well.  She was then discharged over the weekend; however, upon her discharge, she  did complain of some persistent type of back pain; however, was not severe  at that time.  The patient went home, and the back pain worsened in severity  which prompted her readmission to the hospital.  Upon readmission, the  patient noted to have a fever of 102 and an elevated white count at 20,000.  Otherwise, the patient is clinically stable.   PAST MEDICAL HISTORY/PAST SURGICAL HISTORY:  Significant for the  laparoscopic cholecystectomy and ERCP with stent placement, status post mild  pancreatitis.   SOCIAL HISTORY:  The patient  is engaged and works at a bank.   FAMILY HISTORY:  Noncontributory.   REVIEW OF SYSTEMS:  Positive for the back pain, decreased appetite,  significant for fever, and no complaints of any chest pain, shortness of  breath, headache, nausea, vomiting, diarrhea, constipation, dysuria,  arthritis, or myalgias.  No reports of any neurologic dysfunction.   PHYSICAL EXAMINATION:  VITAL SIGNS:  Blood pressure is 104/63.  Heart rate  is 127.  Respirations 26.  Temperature is 102.3.  GENERAL:  The patient is in no acute distress, alert and oriented.  HEENT:  Normocephalic, atraumatic.  Extraocular muscles intact.  Pupils  equal, round, and reactive to light.  NECK:  Supple, no lymphadenopathy.  LUNGS:  Clear to auscultation bilaterally.  CARDIOVASCULAR:  Regular rate and rhythm.  Tachycardic.  ABDOMEN:  Mildly distended and tympanitic.  Positive bowel sounds, although  decreased.  Soft, nontender.  No pain in the epigastric region.  Mild pain  in the right upper quadrant.  EXTREMITIES:  No cyanosis, clubbing, or edema.  LABORATORY VALUES:  On July 09, 2004:  White blood cell count is 20.4,  hemoglobin 11.1, platelets 494.  Sodium is 131, potassium 3.4, chloride 100,  CO2 24, BUN 2, creatinine 0.6, glucose 137.  AST is 23, ALT 34, alk phos  205, bilirubin 1.3, albumin 2.2.  On July 08, 2004, amylase 136, lipase  66, albumin 2.7, alkaline phosphatase 248, bilirubin 1.3.  Biloma culture is  negative for growth.  Blood cultures are pending at this time.  CT scan of  the abdomen is significant for the improving perihepatic right upper  quadrant fluid collection, and there is no biliary or ductal dilatation.  Pancreas is normal in size.  No evidence of any pancreatitis.   IMPRESSION:  1.  Cholestasis.  2.  Biloma.  3.  Fever.   On evaluation of the patient, it is felt that she has cholestasis, and it  may be at the biliary stent is obstructed at this time, resulting in the  increase in  the alkaline phosphatase.  There is no evidence of any abscess  upon the CT examination, and there is no growth of any organisms.   PLAN:  Repeat ERCP with possibly a stent change and placement of a larger 10  French stent.  I have discussed the risks of bleeding, infection,  perforation, medication reactions, a 7% chance of post-ERCP pancreatitis;  however, she is at a higher risk, and this was discussed with the patient as  she is in the highest category of having a prior post-ERCP pancreatitis and  being a young female.  Additionally, the risk of death was discussed with  the patient.  All questions were answered, and she wanted to proceed with  the procedure.      PDH/MEDQ  D:  07/09/2004  T:  07/09/2004  Job:  [id number]   cc:   Lorre Munroe., M.D.  Fax: [phone]

## 2011-05-02 LAB — URINE CULTURE: Colony Count: >=100000

## 2011-05-02 LAB — POCT URINALYSIS DIP (DEVICE)
Glucose, UA: 100 — AB
Nitrite: POSITIVE — AB
Operator id: 200941
Protein, ur: >=300 — AB
Specific Gravity, Urine: 1.025
Urobilinogen, UA: 2 — ABNORMAL HIGH
pH: 6

## 2011-05-02 LAB — POCT PREGNANCY, URINE
Operator id: 200941
Preg Test, Ur: NEGATIVE

## 2012-01-03 ENCOUNTER — Emergency Department (HOSPITAL_COMMUNITY)
Admission: EM | Admit: 2012-01-03 | Discharge: 2012-01-03 | Disposition: A | Discharge status: Home / Self Care | Payer: BC Managed Care – PPO | Source: Home / Self Care | Attending: Emergency Medicine | Admitting: Emergency Medicine

## 2012-01-03 ENCOUNTER — Encounter (HOSPITAL_COMMUNITY): Payer: Self-pay

## 2012-01-03 DIAGNOSIS — IMO0001 Reserved for inherently not codable concepts without codable children: Secondary | ICD-10-CM

## 2012-01-03 DIAGNOSIS — L03019 Cellulitis of unspecified finger: Secondary | ICD-10-CM

## 2012-01-03 MED ORDER — CEPHALEXIN 500 MG PO CAPS: 500.0000 mg | ORAL_CAPSULE | Freq: Four times a day (QID) | ORAL | Status: AC

## 2012-01-03 MED ORDER — BACITRACIN 500 UNIT/GM EX OINT: 1.0000 "application " | TOPICAL_OINTMENT | Freq: Once | CUTANEOUS | Status: DC

## 2012-01-03 NOTE — ED Provider Notes (Signed)
History     CSN: [id number]  Arrival date & time 01/03/12  1109   First MD Initiated Contact with Patient 01/03/12 1115      Chief Complaint  Patient presents with  . Nail Problem    (Consider location/radiation/quality/duration/timing/severity/associated sxs/prior treatment) HPI Comments: Patient is a right-handed female who reports pulling a piece of skin off right next to the nail on her left middle finger 4 days ago. Since her reports erythema, tenderness, swelling, whitish discoloration in the skin. No drainage No nausea, vomiting, erythema ex tending proximally, numbness. Pain is worse palpation. She's been taking 400 mg ibuprofen without relief. She is not a diabetic or a smoker  ROS as noted in HPI. All other ROS negative.   Patient is a 36 y.o. female presenting with hand pain. The history is provided by the patient. No language interpreter was used.  Hand Pain This is a new problem. The current episode started more than 2 days ago. The problem occurs constantly. The problem has been gradually worsening. Nothing aggravates the symptoms. Nothing relieves the symptoms.    History reviewed. No pertinent past medical history.  Past Surgical History  Procedure Date  . Cesarean section   . Cholecystectomy     History reviewed. No pertinent family history.  History  Substance Use Topics  . Smoking status: Never Smoker   . Smokeless tobacco: Not on file  . Alcohol Use: No    OB History    Grav Para Term Preterm Abortions TAB SAB Ect Mult Living                  Review of Systems  Allergies  Review of patient's allergies indicates no known allergies.  Home Medications   Current Outpatient Rx  Name Route Sig Dispense Refill  . IBUPROFEN 400 MG PO TABS Oral Take 400 mg by mouth every 6 (six) hours as needed.    . CEPHALEXIN 500 MG PO CAPS Oral Take 1 capsule (500 mg total) by mouth 4 (four) times daily. X 10 days 40 capsule 0    BP 120/83  Pulse 68  Temp  99 F (37.2 C) (Oral)  Resp 18  SpO2 100%  LMP 12/22/2011  Physical Exam  Nursing note and vitals reviewed. Constitutional: She is oriented to person, place, and time. She appears well-developed and well-nourished. No distress.  HENT:  Head: Normocephalic and atraumatic.  Eyes: Conjunctivae and EOM are normal.  Neck: Normal range of motion.  Cardiovascular: Normal rate.   Pulmonary/Chest: Effort normal.  Abdominal: She exhibits no distension.  Musculoskeletal: Normal range of motion.       Left hand: She exhibits tenderness. normal sensation noted. Normal strength noted.       Hands:      Small paronychia, localized swelling and erythema. see drawing. Rest of the hand normal, neurovascularly intact.  Neurological: She is alert and oriented to person, place, and time.  Skin: Skin is warm and dry.  Psychiatric: She has a normal mood and affect. Her behavior is normal. Judgment and thought content normal.    ED Course  INCISION AND DRAINAGE Date/Time: 01/03/2012 1:08 PM Performed by: Luiz Blare Authorized by: Luiz Blare Consent: Verbal consent obtained. Risks and benefits: risks, benefits and alternatives were discussed Consent given by: patient Patient understanding: patient states understanding of the procedure being performed Patient consent: the patient's understanding of the procedure matches consent given Required items: required blood products, implants, devices, and special equipment available Patient  identity confirmed: verbally with patient Time out: Immediately prior to procedure a "time out" was called to verify the correct patient, procedure, equipment, support staff and site/side marked as required. Type: abscess (Paronychia) Body area: upper extremity Location details: left long finger Local anesthetic: topical anesthetic Patient sedated: no Scalpel size: 11 Incision type: single straight Complexity: simple Drainage: purulent Drainage  amount: moderate Wound treatment: wound left open Patient tolerance: Patient tolerated the procedure well with no immediate complications. Comments: Applied bacitracin and sterile pressure dressing   (including critical care time)  Labs Reviewed - No data to display No results found.   1. Paronychia of third finger, left       MDM  Home with Keflex, warm soaks. She'll continue alternating 600-400 mg ibuprofen. Discussed symptoms and signs that should prompt return. We'll refer her primary care resources for f/u prn.   Luiz Blare, MD 01/03/12 1316

## 2012-01-03 NOTE — Discharge Instructions (Signed)
[facility]: [address] [place] [address] [phone]  [facility]: [address] [place] [address]   [phone]  [facility]: [address] [place] [address] [phone]  [facility] : [address] [place] [address] [phone]  [facility]: [address] [place] [address] [phone]  [facility]: [address] [place] [address] [phone]  Dr. Mahima Pandey [address] [facility] [place]  [address] [phone]  

## 2012-01-03 NOTE — ED Notes (Signed)
Pt has infected lt middle finger that started 4 days ago

## 2013-08-19 ENCOUNTER — Encounter (HOSPITAL_COMMUNITY): Payer: Self-pay | Admitting: Emergency Medicine

## 2013-08-19 ENCOUNTER — Emergency Department (HOSPITAL_COMMUNITY)
Admission: EM | Admit: 2013-08-19 | Discharge: 2013-08-19 | Disposition: A | Discharge status: Home / Self Care | Payer: 59 | Source: Home / Self Care | Attending: Family Medicine | Admitting: Family Medicine

## 2013-08-19 DIAGNOSIS — B001 Herpesviral vesicular dermatitis: Secondary | ICD-10-CM

## 2013-08-19 DIAGNOSIS — B009 Herpesviral infection, unspecified: Secondary | ICD-10-CM

## 2013-08-19 MED ORDER — VALACYCLOVIR HCL 1 G PO TABS: 2000.0000 mg | ORAL_TABLET | Freq: Two times a day (BID) | ORAL | Status: AC

## 2013-08-19 NOTE — ED Provider Notes (Signed)
CSN: [id number]     Arrival date & time 08/19/13  1816 History   First MD Initiated Contact with Patient 08/19/13 1902     Chief Complaint  Patient presents with  . Oral Swelling   (Consider location/radiation/quality/duration/timing/severity/associated sxs/prior Treatment) Patient is a 38 y.o. female presenting with mouth sores. The history is provided by the patient.  Mouth Lesions Location:  Upper lip Upper lip location:  R outer Quality:  Painful Pain details:    Quality:  Sore   Duration:  1 day   Timing:  Constant   Progression:  Worsening Onset quality:  Sudden Severity:  Moderate Duration:  10 hours Chronicity:  New Worsened by:  Nothing tried Pt states that on Tuesday she burned the (R) upper corner of her upper lip while eating hot chili. Yesterday she noticed a blister like formation over the burn. Last night she began to have a tingling, burning, painful sensation at the same site. This am when she washed her face the "blister came off and was very red. Through-out the day she has noted swelling and multiple small, circular blister-like bumps in the same spot as her burn. Site if ver TTP. Denies h/o cold sores. Denies fever or other symptoms.   History reviewed. No pertinent past medical history. Past Surgical History  Procedure Laterality Date  . Cesarean section    . Cholecystectomy     History reviewed. No pertinent family history. History  Substance Use Topics  . Smoking status: Never Smoker   . Smokeless tobacco: Not on file  . Alcohol Use: No   OB History   Grav Para Term Preterm Abortions TAB SAB Ect Mult Living                 Review of Systems  Constitutional: Negative.   HENT: Positive for mouth sores.   Eyes: Negative.   Respiratory: Negative.   Cardiovascular: Negative.   Gastrointestinal: Negative.   Endocrine: Negative.   Genitourinary: Negative.   Musculoskeletal: Negative.   Skin: Negative.   Allergic/Immunologic: Negative.     Neurological: Negative.   Hematological: Negative.   Psychiatric/Behavioral: Negative.     Allergies  Review of patient's allergies indicates no known allergies.  Home Medications   Current Outpatient Rx  Name  Route  Sig  Dispense  Refill  . ibuprofen (ADVIL,MOTRIN) 400 MG tablet   Oral   Take 400 mg by mouth every 6 (six) hours as needed.          BP 137/83  Pulse 80  Temp(Src) 98.4 F (36.9 C) (Oral)  Resp 14  SpO2 100%  LMP 07/21/2013 Physical Exam  Constitutional: She is oriented to person, place, and time. She appears well-developed and well-nourished.  HENT:  Head: Normocephalic and atraumatic.  Mouth/Throat:    Approx .5 cm circular cluster of vesicular type lesions associated w/ erythema, mild swelling and TTP. Non-weeping.  Eyes: Conjunctivae are normal.  Neck: Neck supple.  Cardiovascular: Normal rate.   Pulmonary/Chest: Effort normal.  Musculoskeletal: Normal range of motion.  Neurological: She is alert and oriented to person, place, and time.  Skin: Skin is warm and dry.  Psychiatric: She has a normal mood and affect.    ED Course  Procedures (including critical care time) Labs Review Labs Reviewed - No data to display Imaging Review No results found.    MDM  No diagnosis found.  Sx's c/w herpes labialis. Will treat w/ Valtrex and recommend OTC Abreva for external use.  Jeryl Columbia, NP 08/19/13 2025

## 2013-08-19 NOTE — Discharge Instructions (Signed)
Cold Sore A cold sore (fever blister) is a skin infection caused by a certain type of germ (virus). They are small sores filled with fluid that dry up and heal within 2 weeks. Cold sores form inside of the mouth or on the lips, gums, and other parts of the body. Cold sores can be easily passed (contagious) to other people. This can happen through close personal contact, such as kissing or sharing a drinking glass. HOME CARE  Only take medicine as told by your doctor. Do not use aspirin.  Use a cotton-tip swab to put creams or gels on your sores.  Do not touch sores or pick scabs. Wash your hands often. Do not touch your eyes without washing your hands first.  Avoid kissing, oral sex, and sharing personal items until the sores heal.  Put an ice pack on your sores for 10 15 minutes to ease discomfort.  Avoid hot, cold, or salty foods. Eat a soft, bland diet. Use a straw to drink if it helps lessen pain.  Keep sores clean and dry.  Avoid the sun and limit stress if these things cause you to have sores. Apply sunscreen on your lips if the sun causes cold sores. GET HELP IF:  You have a fever or lasting symptoms for more than 2 3 days.  You have a fever and your symptoms suddenly get worse.  You have yellow-white fluid (not clear) coming from the sores.  You have redness that is spreading.  You have pain or irritation in your eye.  You get sores on your genitals.  Your sores do not heal within 2 weeks.  You have a tough time fighting off sickness and infections (weakened immune system).  You get cold sores often. MAKE SURE YOU:   Understand these instructions.  Will watch your condition.  Will get help right away if you are not doing well or get worse. Document Released: 01/06/2012 Document Reviewed: 01/06/2012 [facility] Patient Information 2014 [place], [place].  Herpes Labialis You have a fever blister or cold sore (herpes labialis). These painful, grouped sores are caused  by one of the herpes viruses (HSV1 most commonly). They are usually found around the lips and mouth, but the same infection can also affect other areas on the face such as the nose and eyes. Herpes infections take about 10 days to heal. They often occur again and again in the same spot. Other symptoms may include numbness and tingling in the involved skin, achiness, fever, and swollen glands in the neck. Colds, emotional stress, injuries, or excess sunlight exposure all seem to make herpes reappear. Herpes lip infections are contagious. Direct contact with these sores can spread the infection. It can also be spread to other parts of your own body. TREATMENT  Herpes labialis is usually self-limited and resolves within 1 week. To reduce pain and swelling, apply ice packs frequently to the sores or suck on popsicles or frozen juice bars. Antiviral medicine may be used by mouth to shorten the duration of the breakout. Avoid spreading the infection by washing your hands often. Be careful not to touch your eyes or genital areas after handling the infected blisters. Do not kiss or have other intimate contact with others. After the blisters are completely healed you may resume contact. Use sunscreen to lessen recurrences.  If this is your first infection with herpes, or if you have a severe or repeated infections, your caregiver may prescribe one of the anti-viral drugs to speed up the healing.  If you have sun-related flare-ups despite the use of sunscreen, starting oral anti-viral medicine before a prolonged exposure (going skiing or to the beach) can prevent most episodes.  SEEK IMMEDIATE MEDICAL CARE IF:  You develop a headache, sleepiness, high fever, vomiting, or severe weakness.  You have eye irritation, pain, blurred vision or redness.  You develop a prolonged infection not getting better in 10 days. Document Released: 07/07/2005 Document Revised: 09/29/2011 Document Reviewed: 05/11/2009 [facility] Patient  Information 2014 [place].

## 2013-08-19 NOTE — ED Notes (Signed)
Reports having a cup of chilly yesterday that burned the right side of lip.  This a.m with washing face rubbed blister off.  Having pain and swelling with more blisters forming.  No otc meds used for symptoms.

## 2013-08-21 NOTE — ED Provider Notes (Signed)
Medical screening examination/treatment/procedure(s) were performed by resident physician or non-physician practitioner and as supervising physician I was immediately available for consultation/collaboration.   Pauline Good MD.   Billy Fischer, MD 08/21/13 1228

## 2014-08-07 ENCOUNTER — Emergency Department (HOSPITAL_COMMUNITY): Payer: 59

## 2014-08-07 ENCOUNTER — Emergency Department (HOSPITAL_COMMUNITY)
Admission: EM | Admit: 2014-08-07 | Discharge: 2014-08-07 | Disposition: A | Discharge status: Home / Self Care | Payer: 59 | Attending: Emergency Medicine | Admitting: Emergency Medicine

## 2014-08-07 ENCOUNTER — Encounter (HOSPITAL_COMMUNITY): Payer: Self-pay | Admitting: *Deleted

## 2014-08-07 DIAGNOSIS — O209 Hemorrhage in early pregnancy, unspecified: Secondary | ICD-10-CM | POA: Diagnosis present

## 2014-08-07 DIAGNOSIS — Z3A01 Less than 8 weeks gestation of pregnancy: Secondary | ICD-10-CM | POA: Diagnosis not present

## 2014-08-07 DIAGNOSIS — N939 Abnormal uterine and vaginal bleeding, unspecified: Secondary | ICD-10-CM

## 2014-08-07 DIAGNOSIS — Z9851 Tubal ligation status: Secondary | ICD-10-CM | POA: Insufficient documentation

## 2014-08-07 LAB — CBC WITH DIFFERENTIAL/PLATELET
BASOS ABS: 0 10*3/uL (ref 0.0–0.1)
BASOS PCT: 1 % (ref 0–1)
Eosinophils Absolute: 0.1 10*3/uL (ref 0.0–0.7)
Eosinophils Relative: 1 % (ref 0–5)
HCT: 36.8 % (ref 36.0–46.0)
HEMOGLOBIN: 12.5 g/dL (ref 12.0–15.0)
LYMPHS ABS: 2.2 10*3/uL (ref 0.7–4.0)
Lymphocytes Relative: 39 % (ref 12–46)
MCH: 29.8 pg (ref 26.0–34.0)
MCHC: 34 g/dL (ref 30.0–36.0)
MCV: 87.6 fL (ref 78.0–100.0)
Monocytes Absolute: 0.5 10*3/uL (ref 0.1–1.0)
Monocytes Relative: 9 % (ref 3–12)
NEUTROS ABS: 3 10*3/uL (ref 1.7–7.7)
NEUTROS PCT: 50 % (ref 43–77)
Platelets: 298 10*3/uL (ref 150–400)
RBC: 4.2 MIL/uL (ref 3.87–5.11)
RDW: 12.8 % (ref 11.5–15.5)
WBC: 5.8 10*3/uL (ref 4.0–10.5)

## 2014-08-07 LAB — URINE MICROSCOPIC-ADD ON

## 2014-08-07 LAB — ABO/RH: ABO/RH(D): O POS

## 2014-08-07 LAB — POC URINE PREG, ED: Preg Test, Ur: POSITIVE — AB

## 2014-08-07 LAB — URINALYSIS, ROUTINE W REFLEX MICROSCOPIC
Bilirubin Urine: NEGATIVE
GLUCOSE, UA: NEGATIVE mg/dL
KETONES UR: NEGATIVE mg/dL
Leukocytes, UA: NEGATIVE
NITRITE: NEGATIVE
Protein, ur: NEGATIVE mg/dL
UROBILINOGEN UA: 0.2 mg/dL (ref 0.0–1.0)
pH: 6 (ref 5.0–8.0)

## 2014-08-07 LAB — WET PREP, GENITAL
Clue Cells Wet Prep HPF POC: NONE SEEN
TRICH WET PREP: NONE SEEN
Yeast Wet Prep HPF POC: NONE SEEN

## 2014-08-07 LAB — GC/CHLAMYDIA PROBE AMP (~~LOC~~) NOT AT ARMC
Chlamydia: NEGATIVE
Neisseria Gonorrhea: NEGATIVE

## 2014-08-07 LAB — HCG, QUANTITATIVE, PREGNANCY: hCG, Beta Chain, Quant, S: 302 m[IU]/mL — ABNORMAL HIGH (ref <5)

## 2014-08-07 MED ORDER — FENTANYL CITRATE 0.05 MG/ML IJ SOLN
50.0000 ug | Freq: Once | INTRAMUSCULAR | Status: AC
Start: 2014-08-07 — End: 2014-08-07
  Administered 2014-08-07: 50 ug via INTRAVENOUS
  Filled 2014-08-07: qty 2

## 2014-08-07 NOTE — ED Provider Notes (Signed)
CSN: [id number]     Arrival date & time 08/07/14  1950 History   First MD Initiated Contact with Patient 08/07/14 1004     Chief Complaint  Patient presents with  . Vaginal Bleeding     (Consider location/radiation/quality/duration/timing/severity/associated sxs/prior Treatment) HPI Karina Long is a 39 year old female G4 P3 with past medical history of C-section, tubal ligation then reversal in 2013, who presents the ER complaining of vaginal bleeding since last night. Patient reports a mild vaginal pain and discharge, then heavy bleeding since. Patient reports she was last seen by her OB/GYN on Friday, who advised that her hCG level was not rising appropriately, and they were concerned about ectopic. Advised her to go to the emergency room today when she called and reported vaginal bleeding. Patient reports mild lower abdominal cramping, without severe pain. Patient denies associated nausea, vomiting, fever, dysuria, diarrhea.  History reviewed. No pertinent past medical history. Past Surgical History  Procedure Laterality Date  . Cesarean section    . Cholecystectomy    . Tubal reversal     No family history on file. History  Substance Use Topics  . Smoking status: Never Smoker   . Smokeless tobacco: Not on file  . Alcohol Use: No   OB History    Gravida Para Term Preterm AB TAB SAB Ectopic Multiple Living   1              Review of Systems  Constitutional: Negative for fever.  HENT: Negative for trouble swallowing.   Eyes: Negative for visual disturbance.  Respiratory: Negative for shortness of breath.   Cardiovascular: Negative for chest pain.  Gastrointestinal: Negative for nausea, vomiting and abdominal pain.       Abdominal cramping  Genitourinary: Positive for vaginal bleeding. Negative for dysuria, vaginal discharge and vaginal pain.  Musculoskeletal: Negative for neck pain.  Skin: Negative for rash.  Neurological: Negative for dizziness, weakness and numbness.   Psychiatric/Behavioral: Negative.       Allergies  Review of patient's allergies indicates no known allergies.  Home Medications   Prior to Admission medications   Not on File   BP 115/67 mmHg  Pulse 92  Temp(Src) 98.8 F (37.1 C) (Oral)  Resp 14  SpO2 97%  LMP 06/25/2014 Physical Exam  Constitutional: She is oriented to person, place, and time. She appears well-developed and well-nourished. No distress.  HENT:  Head: Normocephalic and atraumatic.  Mouth/Throat: Oropharynx is clear and moist. No oropharyngeal exudate.  Eyes: Right eye exhibits no discharge. Left eye exhibits no discharge. No scleral icterus.  Neck: Normal range of motion.  Cardiovascular: Normal rate, regular rhythm and normal heart sounds.   No murmur heard. Pulmonary/Chest: Effort normal and breath sounds normal. No respiratory distress.  Abdominal: Soft. There is no tenderness. Hernia confirmed negative in the right inguinal area and confirmed negative in the left inguinal area.  Genitourinary: There is no tenderness, lesion or injury on the right labia. There is no tenderness, lesion or injury on the left labia. Cervix exhibits no motion tenderness, no discharge and no friability. Right adnexum displays no mass, no tenderness and no fullness. Left adnexum displays no mass, no tenderness and no fullness. There is bleeding in the vagina. No erythema or tenderness in the vagina. No foreign body around the vagina. No signs of injury around the vagina. No vaginal discharge found.  Moderate amount of blood in vaginal vault. Chaperone present during entire exam.   Musculoskeletal: Normal range of motion. She exhibits  no edema or tenderness.  Neurological: She is alert and oriented to person, place, and time. No cranial nerve deficit. Coordination normal.  Skin: Skin is warm and dry. No rash noted. She is not diaphoretic.  Psychiatric: She has a normal mood and affect.  Nursing note and vitals reviewed.   ED  Course  Procedures (including critical care time) Labs Review Labs Reviewed  WET PREP, GENITAL - Abnormal; Notable for the following:    WBC, Wet Prep HPF POC FEW (*)    All other components within normal limits  HCG, QUANTITATIVE, PREGNANCY - Abnormal; Notable for the following:    hCG, Beta Chain, Quant, S 302 (*)    All other components within normal limits  URINALYSIS, ROUTINE W REFLEX MICROSCOPIC - Abnormal; Notable for the following:    APPearance HAZY (*)    Specific Gravity, Urine <1.005 (*)    Hgb urine dipstick LARGE (*)    All other components within normal limits  URINE MICROSCOPIC-ADD ON - Abnormal; Notable for the following:    Squamous Epithelial / LPF FEW (*)    All other components within normal limits  POC URINE PREG, ED - Abnormal; Notable for the following:    Preg Test, Ur POSITIVE (*)    All other components within normal limits  CBC WITH DIFFERENTIAL  ABO/RH  GC/CHLAMYDIA PROBE AMP ([place])    Imaging Review US Ob Comp Less 14 Wks  08/07/2014   CLINICAL DATA:  Vaginal bleeding, increased over past study  EXAM: OBSTETRIC <14 WK [place] AND TRANSVAGINAL OB US  TECHNIQUE: Both transabdominal and transvaginal ultrasound examinations were performed for complete evaluation of the gestation as well as the maternal uterus, adnexal regions, and pelvic cul-de-sac. Transvaginal technique was performed to assess early pregnancy.  COMPARISON:  None.  FINDINGS: Intrauterine gestational sac: Not visualized  Yolk sac:  Not visualized  Embryo:  Not visualized  Cardiac Activity: Not visualized  Uterus measures 8.1 x 5.3 x 5.2 cm. There is a 1.4 x 1.7 x 1.3 cm hypoechoic mass within the uterus consistent with a leiomyoma. The endometrium measures 12 mm in thickness. There is echogenic material seen to move in the lower uterine segment. The echogenicity of the fluid in the endometrium appears overall uniform. Cervical os is closed.  There is no appreciable ovarian/extrauterine pelvic  mass or free pelvic fluid. Doppler signal is noted in each ovary.  IMPRESSION: No intrauterine or extrauterine gestation is appreciable on this study. There is mobile fluid in the lower uterine segment. This finding raises question of recent spontaneous abortion. No well-defined products of conception or seen. There are no extrauterine masses or free fluid. 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  Differential considerations given this overall appearance and clinical situation include recent spontaneous abortion with residual hemorrhage in the endometrium; intrauterine gestation too early to be seen by either transabdominal or transvaginal technique; possible ectopic gestation. These findings warrant close clinical and laboratory surveillance. Repeat imaging timing will in part depend on beta HCG findings. Given mobile fluid in the endometrium of the lower uterine segment, a repeat ultrasound in 5-7 days likely should be performed to further assess this finding even if beta HCG values drop to 0.   Electronically Signed   By: Lowella Grip M.D.   On: 08/07/2014 15:03   US Ob Transvaginal  08/07/2014   CLINICAL DATA:  Vaginal bleeding, increased over past study  EXAM: OBSTETRIC <14 WK [place] AND TRANSVAGINAL OB US  TECHNIQUE: Both  transabdominal and transvaginal ultrasound examinations were performed for complete evaluation of the gestation as well as the maternal uterus, adnexal regions, and pelvic cul-de-sac. Transvaginal technique was performed to assess early pregnancy.  COMPARISON:  None.  FINDINGS: Intrauterine gestational sac: Not visualized  Yolk sac:  Not visualized  Embryo:  Not visualized  Cardiac Activity: Not visualized  Uterus measures 8.1 x 5.3 x 5.2 cm. There is a 1.4 x 1.7 x 1.3 cm hypoechoic mass within the uterus consistent with a leiomyoma. The endometrium measures 12 mm in thickness. There is echogenic material seen to move in the lower uterine segment. The echogenicity of the  fluid in the endometrium appears overall uniform. Cervical os is closed.  There is no appreciable ovarian/extrauterine pelvic mass or free pelvic fluid. Doppler signal is noted in each ovary.  IMPRESSION: No intrauterine or extrauterine gestation is appreciable on this study. There is mobile fluid in the lower uterine segment. This finding raises question of recent spontaneous abortion. No well-defined products of conception or seen. There are no extrauterine masses or free fluid. 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  Differential considerations given this overall appearance and clinical situation include recent spontaneous abortion with residual hemorrhage in the endometrium; intrauterine gestation too early to be seen by either transabdominal or transvaginal technique; possible ectopic gestation. These findings warrant close clinical and laboratory surveillance. Repeat imaging timing will in part depend on beta HCG findings. Given mobile fluid in the endometrium of the lower uterine segment, a repeat ultrasound in 5-7 days likely should be performed to further assess this finding even if beta HCG values drop to 0.   Electronically Signed   By: Lowella Grip M.D.   On: 08/07/2014 15:03     EKG Interpretation None      MDM   Final diagnoses:  Vaginal bleeding    Patient here with vaginal bleeding since yesterday with previous history of tubal ligation and then reversal. Patient states she should be approximately [redacted] weeks pregnant, has current beta hCG of 300. Will follow up with ultrasound to assess for ectopic. Patient nontoxic, non-tachycardic, normotensive, nontachypneic, non-hypoxic, afebrile, and in no acute distress. Moderate amount of blood noted in vaginal vault without active bleed noted. Patient hemodynamically stable throughout ED stay.  Ultrasound shows results of no visualized gestational contents intrauterine or extrauterine. There is no visualized ectopic pregnancy,  nor was there an IUP. Radiologist's recommendations with follow-up ultrasound in 5-7 days correlated with follow-up hCG values.  I discussed these findings and patient's case with Dr. Charlesetta Garibaldi, who was on-call for patient's OB/GYN Dr. Tracie Harrier. Dr. Charlesetta Garibaldi recommends discharging patient from the emergency room, giving patient return precautions of bleeding greater than 1 pad per hour, pain precautions should patient have uncontrolled pain with NSAIDs, and she recommends follow-up in their office on Wednesday or Thursday of this week. I relayed these recommendations to patient, and patient verbalizes understanding and agreement of this plan. I encouraged patient to call or return to the ER should she have any worsening of symptoms or should she have any questions or concerns.  BP 115/67 mmHg  Pulse 92  Temp(Src) 98.8 F (37.1 C) (Oral)  Resp 14  SpO2 97%  LMP 06/25/2014  Signed,  Dahlia Bailiff, PA-C 4:58 PM   Carrie Mew, PA-C 08/07/14 1658  Veryl Speak, MD 08/08/14 1008

## 2014-08-07 NOTE — Discharge Instructions (Signed)
Please follow up with Dr. Florinda Marker office on Wednesday or Thursday of this week. Return to the ER here or at [facility] hospital if you have any bleeding that is greater than 1 pad per hour, or if your pain is uncontrolled using over-the-counter Tylenol. Your exam and workup were concerning for a possible threatened miscarriage versus an ectopic pregnancy today. It is of utmost importance that you follow-up appropriately to avoid severe complications. Below are information sheets on both threatened miscarriage and ectopic pregnancy.   Threatened Miscarriage A threatened miscarriage occurs when you have vaginal bleeding during your first 20 weeks of pregnancy but the pregnancy has not ended. If you have vaginal bleeding during this time, your health care provider will do tests to make sure you are still pregnant. If the tests show you are still pregnant and the developing baby (fetus) inside your womb (uterus) is still growing, your condition is considered a threatened miscarriage. A threatened miscarriage does not mean your pregnancy will end, but it does increase the risk of losing your pregnancy (complete miscarriage). CAUSES  The cause of a threatened miscarriage is usually not known. If you go on to have a complete miscarriage, the most common cause is an abnormal number of chromosomes in the developing baby. Chromosomes are the structures inside cells that hold all your genetic material. Some causes of vaginal bleeding that do not result in miscarriage include:  Having sex.  Having an infection.  Normal hormone changes of pregnancy.  Bleeding that occurs when an egg implants in your uterus. RISK FACTORS Risk factors for bleeding in early pregnancy include:  Obesity.  Smoking.  Drinking excessive amounts of alcohol or caffeine.  Recreational drug use. SIGNS AND SYMPTOMS  Light vaginal bleeding.  Mild abdominal pain or cramps. DIAGNOSIS  If you have bleeding with or without  abdominal pain before 20 weeks of pregnancy, your health care provider will do tests to check whether you are still pregnant. One important test involves using sound waves and a computer (ultrasound) to create images of the inside of your uterus. Other tests include an internal exam of your vagina and uterus (pelvic exam) and measurement of your baby's heart rate.  You may be diagnosed with a threatened miscarriage if:  Ultrasound testing shows you are still pregnant.  Your baby's heart rate is strong.  A pelvic exam shows that the opening between your uterus and your vagina (cervix) is closed.  Your heart rate and blood pressure are stable.  Blood tests confirm you are still pregnant. TREATMENT  No treatments have been shown to prevent a threatened miscarriage from going on to a complete miscarriage. However, the right home care is important.  HOME CARE INSTRUCTIONS   Make sure you keep all your appointments for prenatal care. This is very important.  Get plenty of rest.  Do not have sex or use tampons if you have vaginal bleeding.  Do not douche.  Do not smoke or use recreational drugs.  Do not drink alcohol.  Avoid caffeine. SEEK MEDICAL CARE IF:  You have light vaginal bleeding or spotting while pregnant.  You have abdominal pain or cramping.  You have a fever. SEEK IMMEDIATE MEDICAL CARE IF:  You have heavy vaginal bleeding.  You have blood clots coming from your vagina.  You have severe low back pain or abdominal cramps.  You have fever, chills, and severe abdominal pain. MAKE SURE YOU:  Understand these instructions.  Will watch your condition.  Will get help right  away if you are not doing well or get worse. Document Released: 07/07/2005 Document Revised: 07/12/2013 Document Reviewed: 05/03/2013 [facility] Patient Information 2015 [place], [place]. This information is not intended to replace advice given to you by your health care provider. Make sure you  discuss any questions you have with your health care provider.  Ectopic Pregnancy An ectopic pregnancy is when the fertilized egg attaches (implants) outside the uterus. Most ectopic pregnancies occur in the fallopian tube. Rarely do ectopic pregnancies occur on the ovary, intestine, pelvis, or cervix. In an ectopic pregnancy, the fertilized egg does not have the ability to develop into a normal, healthy baby.  A ruptured ectopic pregnancy is one in which the fallopian tube gets torn or bursts and results in internal bleeding. Often there is intense abdominal pain, and sometimes, vaginal bleeding. Having an ectopic pregnancy can be life threatening. If left untreated, this dangerous condition can lead to a blood transfusion, abdominal surgery, or even death. CAUSES  Damage to the fallopian tubes is the suspected cause in most ectopic pregnancies.  RISK FACTORS Depending on your circumstances, the risk of having an ectopic pregnancy will vary. The level of risk can be divided into three categories. High Risk  You have gone through infertility treatment.  You have had a previous ectopic pregnancy.  You have had previous tubal surgery.  You have had previous surgery to have the fallopian tubes tied (tubal ligation).  You have tubal problems or diseases.  You have been exposed to DES. DES is a medicine that was used until 1971 and had effects on babies whose mothers took the medicine.  You become pregnant while using an intrauterine device (IUD) for birth control. Moderate Risk  You have a history of infertility.  You have a history of a sexually transmitted infection (STI).  You have a history of pelvic inflammatory disease (PID).  You have scarring from endometriosis.  You have multiple sexual partners.  You smoke. Low Risk  You have had previous pelvic surgery.  You use vaginal douching.  You became sexually active before 41 years of age. SIGNS AND SYMPTOMS  An ectopic  pregnancy should be suspected in anyone who has missed a period and has abdominal pain or bleeding.  You may experience normal pregnancy symptoms, such as:  Nausea.  Tiredness.  Breast tenderness.  Other symptoms may include:  Pain with intercourse.  Irregular vaginal bleeding or spotting.  Cramping or pain on one side or in the lower abdomen.  Fast heartbeat.  Passing out while having a bowel movement.  Symptoms of a ruptured ectopic pregnancy and internal bleeding may include:  Sudden, severe pain in the abdomen and pelvis.  Dizziness or fainting.  Pain in the shoulder area. DIAGNOSIS  Tests that may be performed include:  A pregnancy test.  An ultrasound test.  Testing the specific level of pregnancy hormone in the bloodstream.  Taking a sample of uterus tissue (dilation and curettage, D&C).  Surgery to perform a visual exam of the inside of the abdomen using a thin, lighted tube with a tiny camera on the end (laparoscope). TREATMENT  An injection of a medicine called methotrexate may be given. This medicine causes the pregnancy tissue to be absorbed. It is given if:  The diagnosis is made early.  The fallopian tube has not ruptured.  You are considered to be a good candidate for the medicine. Usually, pregnancy hormone blood levels are checked after methotrexate treatment. This is to be sure the medicine  is effective. It may take 4-6 weeks for the pregnancy to be absorbed (though most pregnancies will be absorbed by 3 weeks). Surgical treatment may be needed. A laparoscope may be used to remove the pregnancy tissue. If severe internal bleeding occurs, a cut (incision) may be made in the lower abdomen (laparotomy), and the ectopic pregnancy is removed. This stops the bleeding. Part of the fallopian tube, or the whole tube, may be removed as well (salpingectomy). After surgery, pregnancy hormone tests may be done to be sure there is no pregnancy tissue left. You  may receive a Rho (D) immune globulin shot if you are Rh negative and the father is Rh positive, or if you do not know the Rh type of the father. This is to prevent problems with any future pregnancy. SEEK IMMEDIATE MEDICAL CARE IF:  You have any symptoms of an ectopic pregnancy. This is a medical emergency. MAKE SURE YOU:  Understand these instructions.  Will watch your condition.  Will get help right away if you are not doing well or get worse. Document Released: 08/14/2004 Document Revised: 11/21/2013 Document Reviewed: 02/03/2013 [facility] Patient Information 2015 [place], [place]. This information is not intended to replace advice given to you by your health care provider. Make sure you discuss any questions you have with your health care provider.

## 2014-08-07 NOTE — ED Notes (Signed)
Pt states she is [redacted] weeks pregnant and last menstrual was 06/25/14.  Pt states pink vag discharge last nite and now heavy bright red bleeding.  Pt reports lower abdominal cramping.  G-4, P-3.  Pt just had tubes reversed 05/02/14 and then got pregnant.  Pt states that she has seen OB/GYN.

## 2014-08-07 NOTE — ED Notes (Signed)
Pelvic Cart set up in room at this time.

## 2015-03-15 IMAGING — US US OB TRANSVAGINAL
1 series · 13 of 28 positions shown · non-contrast
Comparison: None.

CLINICAL DATA: Vaginal bleeding, increased over past study

EXAM:
OBSTETRIC <14 WK US AND TRANSVAGINAL OB US
TECHNIQUE: Both transabdominal and transvaginal ultrasound examinations were
performed for complete evaluation of the gestation as well as the
maternal uterus, adnexal regions, and pelvic cul-de-sac.
Transvaginal technique was performed to assess early pregnancy.

[Series 1: us ob transvaginal · 0.17mm/px · 13 of 81 slices shown]
[im 3/81]
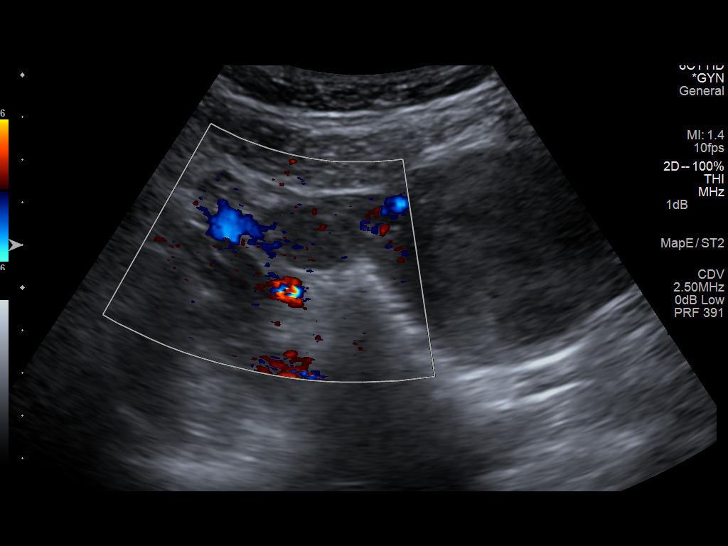
[im 9/81]
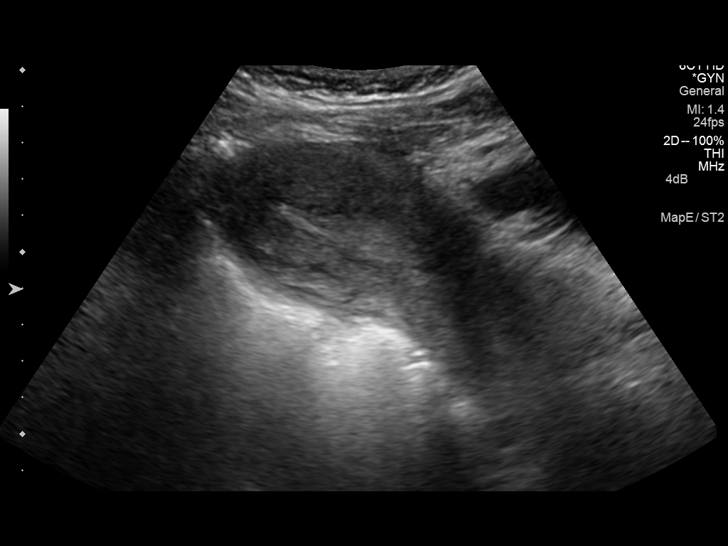
[im 15/81]
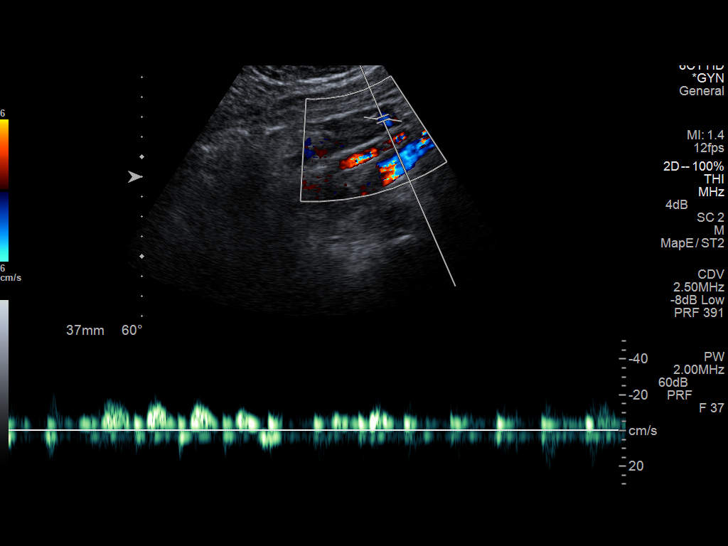
[im 21/81]
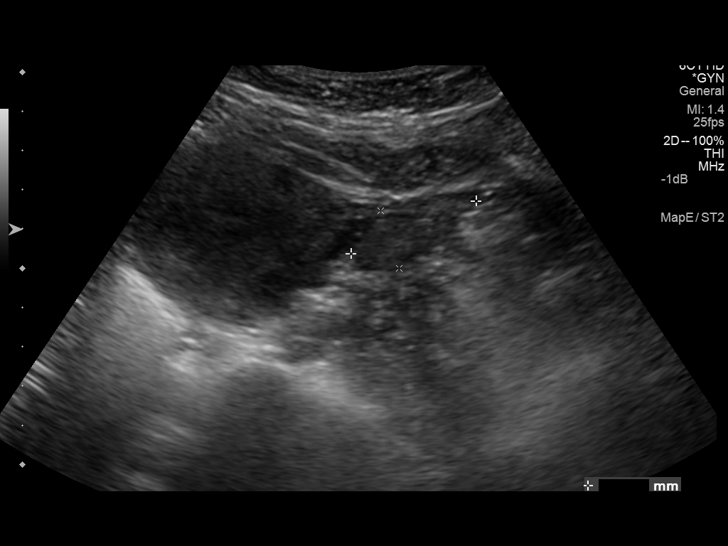
[im 27/81]
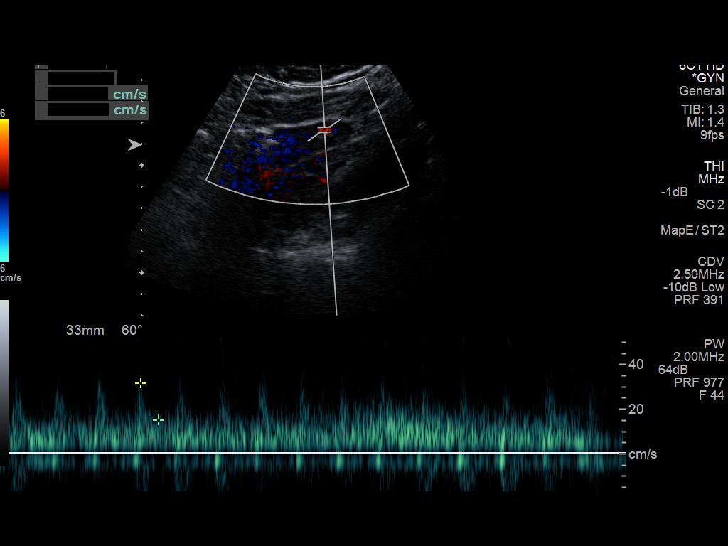
[im 33/81]
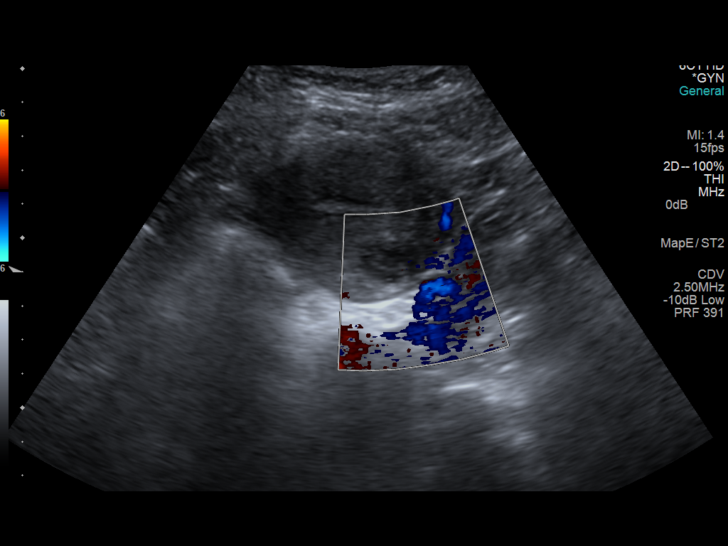
[im 42/81]
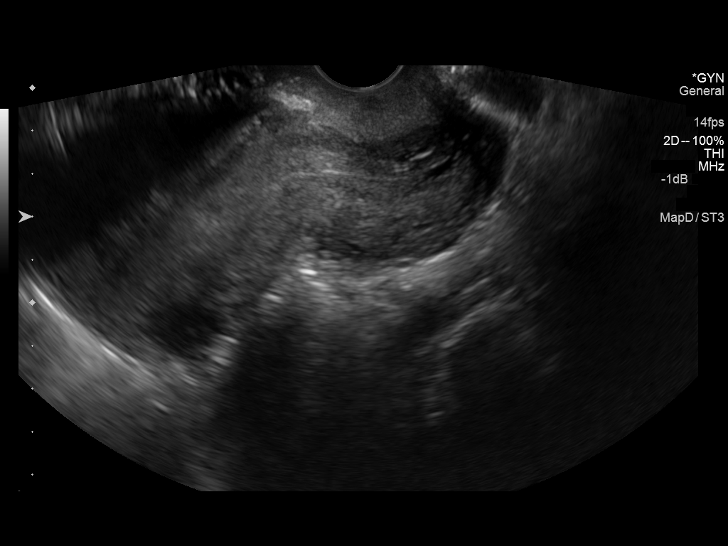
[im 48/81]
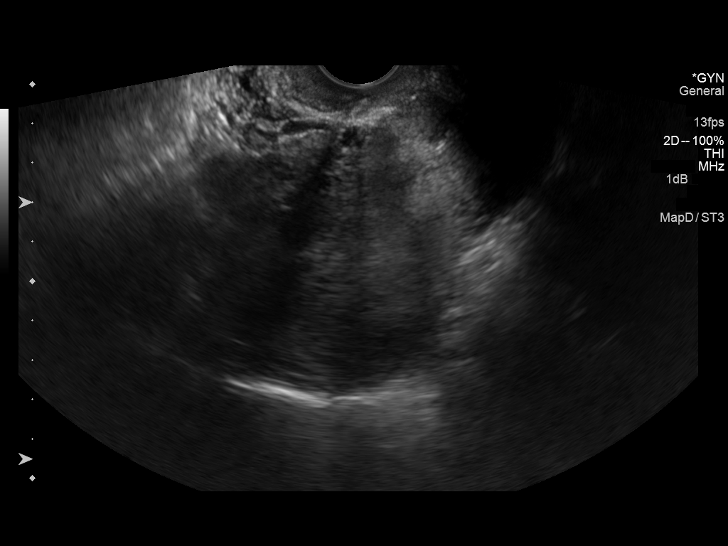
[im 54/81]
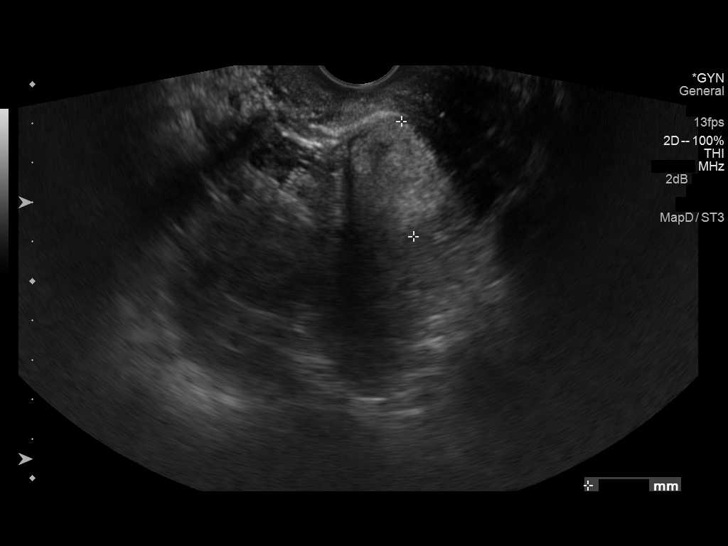
[im 60/81]
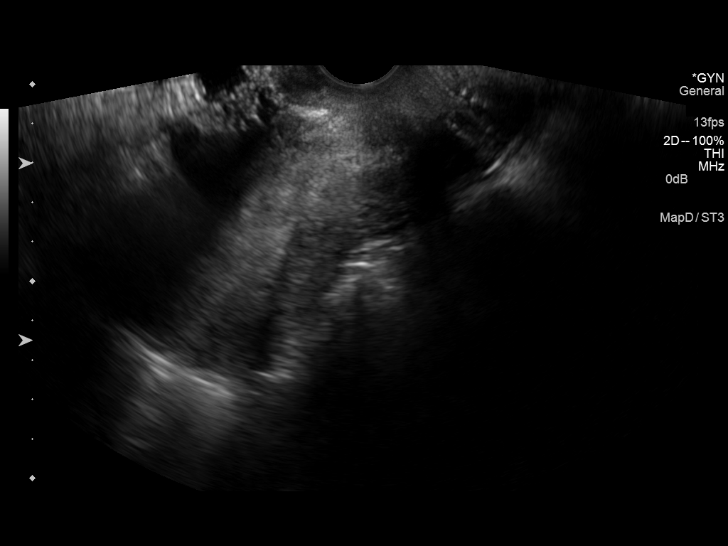
[im 66/81]
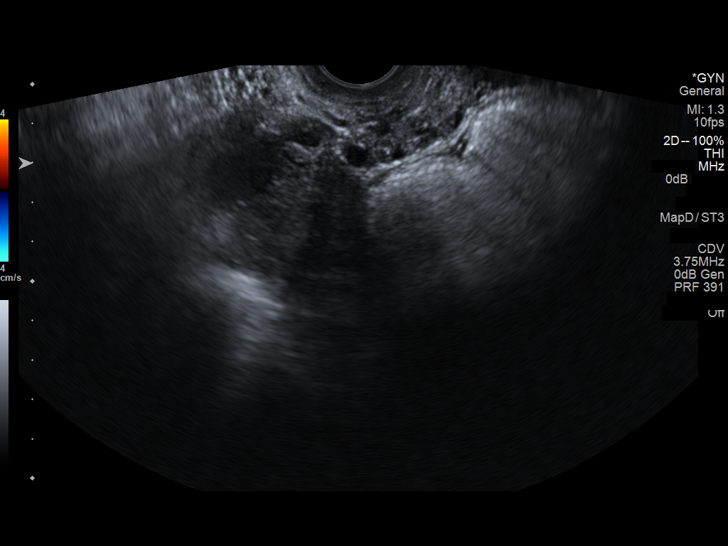
[im 72/81]
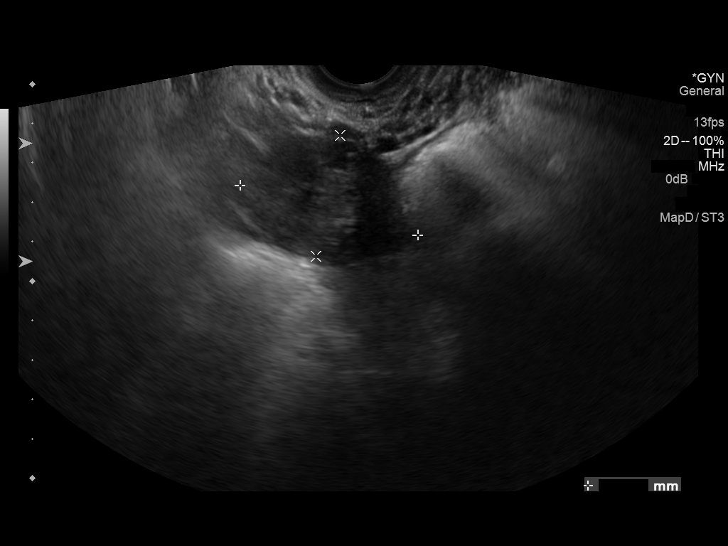
[im 78/81]
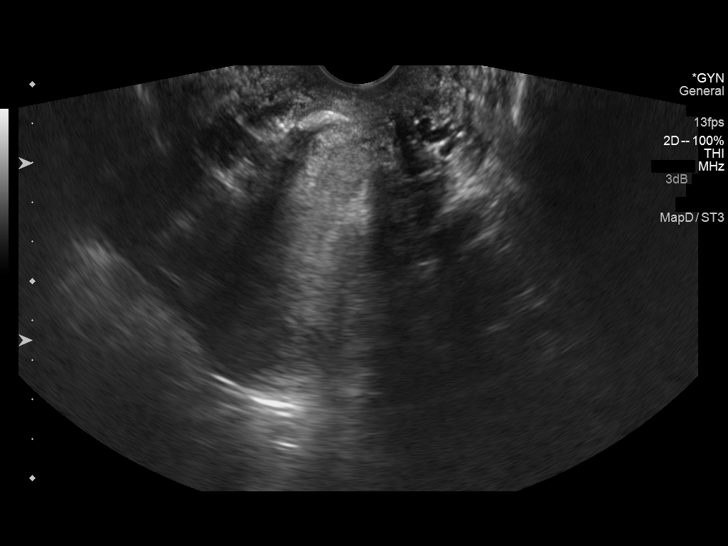

[13 of 28 positions shown; findings below may reference images not displayed]

FINDINGS: Intrauterine gestational sac: Not visualized

Yolk sac:  Not visualized

Embryo:  Not visualized

Cardiac Activity: Not visualized

Uterus measures 8.1 x 5.3 x 5.2 cm. There is a 1.4 x 1.7 x 1.3 cm
hypoechoic mass within the uterus consistent with a leiomyoma. The
endometrium measures 12 mm in thickness. There is echogenic material
seen to move in the lower uterine segment. The echogenicity of the
fluid in the endometrium appears overall uniform. Cervical os is
closed.

There is no appreciable ovarian/extrauterine pelvic mass or free
pelvic fluid. Doppler signal is noted in each ovary.
IMPRESSION: No intrauterine or extrauterine gestation is appreciable on this
study. There is mobile fluid in the lower uterine segment. This
finding raises question of recent spontaneous abortion. No
well-defined products of conception or seen. There are no
extrauterine masses or free fluid. Incidental note is made of an
intrauterine leiomyoma.

Differential considerations given this overall appearance and
clinical situation include recent spontaneous abortion with residual
hemorrhage in the endometrium; intrauterine gestation too early to
be seen by either transabdominal or transvaginal technique; possible
ectopic gestation. These findings warrant close clinical and
laboratory surveillance. Repeat imaging timing will in part depend
on beta HCG findings. Given mobile fluid in the endometrium of the
lower uterine segment, a repeat ultrasound in 5-7 days likely should
be performed to further assess this finding even if beta HCG values
drop to 0.

## 2016-09-11 LAB — OB RESULTS CONSOLE ABO/RH: RH Type: POSITIVE

## 2016-09-11 LAB — OB RESULTS CONSOLE ANTIBODY SCREEN: ANTIBODY SCREEN: NEGATIVE

## 2016-09-11 LAB — OB RESULTS CONSOLE HEPATITIS B SURFACE ANTIGEN: Hepatitis B Surface Ag: NEGATIVE

## 2016-09-11 LAB — OB RESULTS CONSOLE GC/CHLAMYDIA
CHLAMYDIA, DNA PROBE: NEGATIVE
Gonorrhea: NEGATIVE

## 2016-09-11 LAB — OB RESULTS CONSOLE HIV ANTIBODY (ROUTINE TESTING): HIV: NONREACTIVE

## 2016-09-11 LAB — OB RESULTS CONSOLE RUBELLA ANTIBODY, IGM: Rubella: IMMUNE

## 2016-09-11 LAB — OB RESULTS CONSOLE RPR: RPR: NONREACTIVE

## 2016-09-17 DIAGNOSIS — Z310 Encounter for reversal of previous sterilization: Secondary | ICD-10-CM | POA: Insufficient documentation

## 2016-09-17 DIAGNOSIS — Z98891 History of uterine scar from previous surgery: Secondary | ICD-10-CM | POA: Insufficient documentation

## 2016-09-17 DIAGNOSIS — N96 Recurrent pregnancy loss: Secondary | ICD-10-CM | POA: Insufficient documentation

## 2017-01-22 ENCOUNTER — Other Ambulatory Visit: Payer: Self-pay | Admitting: Obstetrics and Gynecology

## 2017-01-29 ENCOUNTER — Encounter (HOSPITAL_COMMUNITY): Payer: Self-pay

## 2017-01-29 ENCOUNTER — Inpatient Hospital Stay (HOSPITAL_COMMUNITY)
Admission: AD | Admit: 2017-01-29 | Discharge: 2017-02-07 | DRG: 782 | Disposition: A | Discharge status: Home / Self Care | Payer: 59 | Source: Ambulatory Visit | Attending: Obstetrics & Gynecology | Admitting: Obstetrics & Gynecology

## 2017-01-29 DIAGNOSIS — O09523 Supervision of elderly multigravida, third trimester: Secondary | ICD-10-CM | POA: Diagnosis not present

## 2017-01-29 DIAGNOSIS — Z3A29 29 weeks gestation of pregnancy: Secondary | ICD-10-CM | POA: Diagnosis not present

## 2017-01-29 DIAGNOSIS — O4693 Antepartum hemorrhage, unspecified, third trimester: Secondary | ICD-10-CM | POA: Diagnosis present

## 2017-01-29 DIAGNOSIS — O34211 Maternal care for low transverse scar from previous cesarean delivery: Secondary | ICD-10-CM | POA: Diagnosis present

## 2017-01-29 HISTORY — DX: Benign neoplasm of connective and other soft tissue, unspecified: D21.9

## 2017-01-29 LAB — CBC
HCT: 36.5 % (ref 36.0–46.0)
Hemoglobin: 12.8 g/dL (ref 12.0–15.0)
MCH: 32.2 pg (ref 26.0–34.0)
MCHC: 35.1 g/dL (ref 30.0–36.0)
MCV: 91.7 fL (ref 78.0–100.0)
Platelets: 265 10*3/uL (ref 150–400)
RBC: 3.98 MIL/uL (ref 3.87–5.11)
RDW: 13 % (ref 11.5–15.5)
WBC: 8.2 10*3/uL (ref 4.0–10.5)

## 2017-01-29 LAB — OB RESULTS CONSOLE GBS: STREP GROUP B AG: POSITIVE

## 2017-01-29 MED ORDER — LACTATED RINGERS IV BOLUS (SEPSIS)
500.0000 mL | Freq: Once | INTRAVENOUS | Status: AC
  Administered 2017-01-29: 226 mL via INTRAVENOUS

## 2017-01-29 MED ORDER — PRENATAL MULTIVITAMIN CH
1.0000 | ORAL_TABLET | Freq: Every day | ORAL | Status: DC
Start: 2017-01-30 — End: 2017-02-07
  Administered 2017-01-30 – 2017-02-06 (×8): 1 via ORAL
  Filled 2017-01-29 (×9): qty 1

## 2017-01-29 MED ORDER — CALCIUM CARBONATE ANTACID 500 MG PO CHEW: 2.0000 | CHEWABLE_TABLET | ORAL | Status: DC | PRN

## 2017-01-29 MED ORDER — ACETAMINOPHEN 325 MG PO TABS
650.0000 mg | ORAL_TABLET | ORAL | Status: DC | PRN
  Administered 2017-01-31 – 2017-02-02 (×4): 650 mg via ORAL
  Filled 2017-01-29 (×4): qty 2

## 2017-01-29 MED ORDER — BETAMETHASONE SOD PHOS & ACET 6 (3-3) MG/ML IJ SUSP
12.0000 mg | INTRAMUSCULAR | Status: AC
  Administered 2017-01-29 – 2017-01-30 (×2): 12 mg via INTRAMUSCULAR
  Filled 2017-01-29 (×2): qty 2

## 2017-01-29 MED ORDER — ZOLPIDEM TARTRATE 5 MG PO TABS: 5.0000 mg | ORAL_TABLET | Freq: Every evening | ORAL | Status: DC | PRN

## 2017-01-29 MED ORDER — DOCUSATE SODIUM 100 MG PO CAPS
100.0000 mg | ORAL_CAPSULE | Freq: Every day | ORAL | Status: DC
  Administered 2017-01-31 – 2017-02-07 (×8): 100 mg via ORAL
  Filled 2017-01-29 (×8): qty 1

## 2017-01-29 NOTE — MAU Note (Signed)
Urine in the lab  

## 2017-01-29 NOTE — H&P (Signed)
ANTEPARTUM ADMISSION HISTORY AND PHYSICAL NOTE   History of Present Illness: Karina Long is a 41 y.o. [phone] at [redacted]w[redacted]d admitted for vaginal bleeding @ 29+[redacted] weeks gestation. Pt was evaluated in office today for vaginal bleeding that started around noon. Reports no abnormal activity. States she only sees blood when she wipes. Cervical exam at the office confirms blood coming from the cervix. U/S shows no evidence of abruption. AFI normal. Vtx presentation. GC and [place]/ GBS cx's collected in office   Posterior placenta Patient reports the fetal movement as active. Patient reports uterine contraction  activity as none. Patient reports  vaginal bleeding as less flow than a normal period. Patient describes fluid per vagina as None. Fetal presentation is cephalic.  Patient Active Problem List   Diagnosis Date Noted  . Vaginal bleeding in pregnancy, third trimester 01/29/2017  . History of cesarean section 09/17/2016    Past Medical History:  Diagnosis Date  . Fibroid    uterine    Past Surgical History:  Procedure Laterality Date  . CESAREAN SECTION    . CHOLECYSTECTOMY    . tubal reversal      OB History  Gravida Para Term Preterm AB Living  4 3 3     3   SAB TAB Ectopic Multiple Live Births          2    # Outcome Date GA Lbr Len/2nd Weight Sex Delivery Anes PTL Lv  4 Current           3 Term 07/17/05    F CS-LTranv Spinal    2 Term 12/28/00    M CS-LTranv   LIV     Complications: Prolapsed cord  1 Term 11/25/96    M  EPI  LIV      Social History   Social History  . Marital status: Single    Spouse name: N/A  . Number of children: N/A  . Years of education: N/A   Social History Main Topics  . Smoking status: Never Smoker  . Smokeless tobacco: Never Used  . Alcohol use No  . Drug use: No  . Sexual activity: Yes    Birth control/ protection: None   Other Topics Concern  . None   Social History Narrative  . None    No family history on  file.  No Known Allergies  No prescriptions prior to admission.    Review of Systems - Negative except vaginal bleeding  Vitals:  BP 120/79 (BP Location: Left Arm)   Pulse 99   Temp 98.3 F (36.8 C) (Oral)   Resp 16   SpO2 98%  Physical Examination: GENERAL: Well-developed, well-nourished female in no acute distress.  LUNGS: Clear to auscultation bilaterally.  HEART: Regular rate and rhythm. ABDOMEN: Soft, nontender, nondistended. No organomegaly. EXTREMITIES: No cyanosis, clubbing, or edema, 2+ distal pulses with DTRs 2+ bilaterally CERVIX: Evaluated by sterile speculum exam. and Dilation: 0cm and found to be Long/-3 and fetal presentation is cephalic.  Membranes:intact Fetal Monitoring:category 1 strip Tocometer: uterine irritability  Labs:  No results found for this or any previous visit (from the past 24 hour(s)).  Imaging Studies: No results found.   Prenatal Transfer Tool  Maternal Diabetes: No Genetic Screening: Normal Maternal Ultrasounds/Referrals: Normal Fetal Ultrasounds or other Referrals:  None Maternal Substance Abuse:  No Significant Maternal Medications:  None Significant Maternal Lab Results: Lab values include: Other: pending   Assessment and Plan: Patient Active Problem List   Diagnosis Date Noted  .  Vaginal bleeding in pregnancy, third trimester 01/29/2017  . History of cesarean section 09/17/2016   A 1. AMA     2. Previous C/S     3.29w 5 d     4. Vaginal bleeding in 3rd trimester  Admit to Antenatal Betamethasone x 2 doses NST q shift continous toco Saline lock CBC Type and screen MFM consult   Routine antenatal care  Yvonne Kendall, CNM

## 2017-01-29 NOTE — Progress Notes (Signed)
Hospital day # 0 pregnancy at [redacted]w[redacted]d--vaginal bleeding in pregnancy  S:  Pt comfortable.  FM+        Perception of contractions: none      Vaginal bleeding: none now and spotting       Vaginal discharge: Denies  O: BP 120/77 (BP Location: Right Arm)   Pulse 90   Temp 98.8 F (37.1 C) (Oral)   Resp 16   Ht 5\' 3"  (1.6 m)   Wt 68.5 kg (151 lb)   SpO2 98%   BMI 26.75 kg/m       Fetal tracings: FHT BL 145 variability present      Contractions:   Irregular      Uterus gravid and non-tender      Extremities: extremities normal, atraumatic, no cyanosis or edema and no significant edema and no signs of DVT          Labs: pending       Meds: Betamethasone  A: [redacted]w[redacted]d with vaginal bleeding     gradually improving  Only light spotting noted.  Contractions decreased with IV bolus  P: Continue current plan of care      Upcoming tests/treatments:  MFM consult in AM      MDs will follow  Starla Link CNM, MSN 01/29/2017 9:03 PM

## 2017-01-30 ENCOUNTER — Encounter (HOSPITAL_COMMUNITY): Payer: 59

## 2017-01-30 ENCOUNTER — Inpatient Hospital Stay (HOSPITAL_COMMUNITY): Payer: 59

## 2017-01-30 LAB — ABO/RH: ABO/RH(D): O POS

## 2017-01-30 LAB — TYPE AND SCREEN
ABO/RH(D): O POS
Antibody Screen: NEGATIVE

## 2017-01-30 MED ORDER — LACTATED RINGERS IV SOLN
INTRAVENOUS | Status: DC
  Administered 2017-01-30 – 2017-02-02 (×10): via INTRAVENOUS

## 2017-01-30 NOTE — Progress Notes (Signed)
Hospital day # 1 pregnancy at [redacted]w[redacted]d   S: well, reports good fetal activity      Contractions:none      Vaginal bleeding:none now and spotting       Vaginal discharge: no significant change  O: BP 118/74 (BP Location: Right Arm)   Pulse 98   Temp 98.7 F (37.1 C) (Oral)   Resp 17   Ht 5\' 3"  (1.6 m)   Wt 151 lb (68.5 kg)   SpO2 100%   BMI 26.75 kg/m       Fetal tracings:Fetal Non-stress Test: reactive reviewed and reassuring      Uterus gravid and non-tender      Extremities: extremities normal, atraumatic, no cyanosis or edema and no significant edema and no signs of DVT  A: [redacted]w[redacted]d with vaginal bleeding possible abruption     stable  P: continue current plan of care  Bertram Gala Clemmons  CNM 01/30/2017 12:46 PM

## 2017-01-30 NOTE — Progress Notes (Signed)
Patient ID: Karina Long, female   DOB: 07/15/1976, 42 y.o.   MRN: [id number]  Pt no longer with active bleeding or contractions.  No LOF BP 118/74 (BP Location: Right Arm)   Pulse 98   Temp 98.7 F (37.1 C) (Oral)   Resp 17   Ht 5\' 3"  (1.6 m)   Wt 151 lb (68.5 kg)   SpO2 100%   BMI 26.75 kg/m  abd NT soft gu  No active VB Ext no CCE B Cat 1 tracing occ variable and no contractions Repeat US r/o abruption  BMZ second dose today Bed rest Monitor bleeding SCDS Will follow recommendation of MFM and keep for five days without bleeding

## 2017-01-30 NOTE — Consult Note (Signed)
Maternal Fetal Medicine Consultation  Requesting Provider(s): Yvonne Kendall, CNM  Reason for consultation: Unexplained third trimester vaginal bleeding  HPI: Karina Long is a 41 yo [id number], EDD 04/11/2017 who is currently at 29w 6d seen for consultation due to unexplained third trimester vaginal bleeding.  The patient reports that she noticed some blood on the toilet paper after using the restroom yesterday.  She was seen in the clinic and noted to have some blood pooling in the vagina.  She has not experienced enough bleeding to fill a pad.  Since admission, the bleeding has tapered off and is mostly spotting.  She denies abdominal pain. The fetus is active. She denies uterine contractions and the fetal tracing has been reassuring.  [name] past OB history is as follows:  1. Term SVD without complications 2. C-section at term due to "cord prolapse" 3. Elective scheduled repeat C-section at term  The patient's prenatal course has otherwise been uncomplicated. The fetus is active. She is otherwise without complaints. Blood type O+.  OB History: OB History    Gravida Para Term Preterm AB Living   4 3 3     3    SAB TAB Ectopic Multiple Live Births           2      PMH:  Past Medical History:  Diagnosis Date  . Fibroid    uterine    PSH:  Past Surgical History:  Procedure Laterality Date  . CESAREAN SECTION    . CHOLECYSTECTOMY    . tubal reversal     Meds:  Scheduled Meds: . betamethasone acetate-betamethasone sodium phosphate  12 mg Intramuscular Q24H  . docusate sodium  100 mg Oral Daily  . prenatal multivitamin  1 tablet Oral Q1200   Continuous Infusions: . lactated ringers     PRN Meds:.acetaminophen, calcium carbonate, zolpidem  Allergies: No Known Allergies   FH: Denies birth defects or hereditary disorders  Soc:  Social History   Social History  . Marital status: Single    Spouse name: N/A  . Number of children: N/A  . Years of education: N/A    Occupational History  . Not on file.   Social History Main Topics  . Smoking status: Never Smoker  . Smokeless tobacco: Never Used  . Alcohol use No  . Drug use: No  . Sexual activity: Yes    Birth control/ protection: None   Other Topics Concern  . Not on file   Social History Narrative  . No narrative on file   Review of Systems: no vaginal bleeding or cramping/contractions, no LOF, no nausea/vomiting. All other systems reviewed and are negative.  PE:   Vitals:   01/30/17 0405 01/30/17 0832  BP: 123/71 121/77  Pulse: 81 99  Resp: 16 17  Temp: 98.9 F (37.2 C) 98.2 F (36.8 C)    A/P: 1) Single IUP at 29w 6d  2) Advanced maternal age > 79  3) Unexplained 3rd trimester vaginal bleeding - no evidence of abruption noted on clinic ultrasound per patient report.  The patient's vaginal bleeding has improved since admission and now reports only some spotting without active bright red bleeding and fetal testing has been reassuring.  We briefly discussed the differential diagnosis of 3rd trimester vaginal bleeding. Concur with course of betamethasone.  In general, would recommend inpatient observation for ~ 5 days without vaginal bleeding.  If stable, would feel comfortable discharging the patient home with close outpatient follow up.  I would  recommend serial ultrasounds for growth every ~ 4 weeks and antenatal testing starting no later than [redacted] weeks gestation.  Given the patient's advanced maternal age, would recommend delivery by her EDD or sooner based on the clinical scenario.  Thank you for the opportunity to be a part of the care of [name]. Please contact our office if we can be of further assistance.   I spent approximately 30 minutes with this patient with over 50% of time spent in face-to-face counseling.  Benjaman Lobe, MD Maternal Fetal Medicine

## 2017-01-31 DIAGNOSIS — O09529 Supervision of elderly multigravida, unspecified trimester: Secondary | ICD-10-CM | POA: Insufficient documentation

## 2017-01-31 MED ORDER — LACTATED RINGERS IV BOLUS (SEPSIS)
500.0000 mL | Freq: Once | INTRAVENOUS | Status: AC
  Administered 2017-01-31: 500 mL via INTRAVENOUS

## 2017-01-31 MED ORDER — CYCLOBENZAPRINE HCL 5 MG PO TABS
5.0000 mg | ORAL_TABLET | Freq: Three times a day (TID) | ORAL | Status: DC | PRN
  Administered 2017-02-01: 5 mg via ORAL
  Filled 2017-01-31 (×2): qty 1

## 2017-01-31 MED ORDER — NIFEDIPINE 10 MG PO CAPS
10.0000 mg | ORAL_CAPSULE | Freq: Four times a day (QID) | ORAL | Status: DC
  Administered 2017-01-31 – 2017-02-06 (×25): 10 mg via ORAL
  Filled 2017-01-31 (×24): qty 1

## 2017-01-31 NOTE — Progress Notes (Addendum)
Hospital day # 2 pregnancy at [redacted]w[redacted]d--Vaginal bleeding.  S:  Doing well--reports some mild back pain.      Perception of contractions: None      Vaginal bleeding: Spotting when wiping, "dark pink", no bleeding on pad       Vaginal discharge:  None  O: BP 121/73 (BP Location: Right Arm)   Pulse 95   Temp 98.6 F (37 C) (Oral)   Resp 18   Ht 5\' 3"  (1.6 m)   Wt 68.5 kg (151 lb)   SpO2 93%   BMI 26.75 kg/m       Fetal tracings:  Segments of Cat 1, occasional quick variables.      Contractions:   Irritability, on continuous toco      Uterus non-tender      Extremities: no significant edema and no signs of DVT.  SCDs in place      while in bed          Labs:   Results for orders placed or performed during the hospital encounter of 01/29/17 (from the past 48 hour(s))  CBC on admission     Status: None   Collection Time: 01/29/17  6:31 PM  Result Value Ref Range   WBC 8.2 4.0 - 10.5 K/uL   RBC 3.98 3.87 - 5.11 MIL/uL   Hemoglobin 12.8 12.0 - 15.0 g/dL   HCT 36.5 36.0 - 46.0 %   MCV 91.7 78.0 - 100.0 fL   MCH 32.2 26.0 - 34.0 pg   MCHC 35.1 30.0 - 36.0 g/dL   RDW 13.0 11.5 - 15.5 %   Platelets 265 150 - 400 K/uL  Type and screen [place]     Status: None   Collection Time: 01/30/17  7:43 AM  Result Value Ref Range   ABO/RH(D) O POS    Antibody Screen NEG    Sample Expiration 02/02/2017   ABO/Rh     Status: None   Collection Time: 01/30/17  7:50 AM  Result Value Ref Range   ABO/RH(D) O POS          Meds:  . docusate sodium  100 mg Oral Daily  . prenatal multivitamin  1 tablet Oral Q1200   Completed betamethasone course 01/30/17 at 2050.  [place] results from 01/30/17: EFW 3 lbs, 40%ile AFI 18.76, 71%ile Vtx Posterior placenta Cervix 5 cm No previa, no subchorionic fluid collections/hemorrhage identified.  A: [redacted]w[redacted]d with vaginal bleeding      Previous C/S x 2, prior vaginal delivery      AMA     Stable  P: Continue current plan of care  Anticipate admission until no bleeding x 5 days, per MFM           recommendations.      Upcoming tests/treatments:  IV bolus, Tylenol      MDs will follow  Donnel Saxon CNM, [place] 01/31/2017 8:42 AM  Pt seen and examined.  Offered flexeril for back pain but declined Pt started on procardia NICU consult

## 2017-02-01 MED ORDER — LACTATED RINGERS IV BOLUS (SEPSIS)
250.0000 mL | Freq: Once | INTRAVENOUS | Status: AC
  Administered 2017-02-01: 250 mL via INTRAVENOUS

## 2017-02-01 NOTE — Progress Notes (Addendum)
Hospital day # 3 pregnancy at [redacted]w[redacted]d--Vaginal bleeding  S:  Doing well--reports less back pain through night and at present.      Perception of contractions: None      Vaginal bleeding: Small amount pink spotting with wiping, no active bleeding       Vaginal discharge:  None  O: BP 97/61 (BP Location: Right Arm)   Pulse 93   Temp 98.4 F (36.9 C) (Oral)   Resp 18   Ht 5\' 3"  (1.6 m)   Wt 68.5 kg (151 lb)   SpO2 96%   BMI 26.75 kg/m       Fetal tracings:  Baseline 150-160 through night, decreased variability after taking Flexeril at 0010.  Very occasional quick variables.  Segments of Category 1 just prior to midnight.  Received IV bolus aroud 0430.        Contractions:   Mild irritability at times      Uterus non-tender      Extremities: no significant edema and no signs of DVT          Labs:  T&S current on 01/30/17       Meds:  . docusate sodium  100 mg Oral Daily  . NIFEdipine  10 mg Oral Q6H  . prenatal multivitamin  1 tablet Oral Q1200    A: [redacted]w[redacted]d with vaginal bleeding, now minimal spotting.     S/p betamethasone course 7/12-7/13.     Stable  P: Continue current plan of care      Upcoming tests/treatments:  Continuous EFM, T&S q 72 hours,                   Procardia 10 mg po q 6 hours.       Continue to observe FHR tracing--patient to eat breakfast, will hydrate this am.      MDs will follow  Donnel Saxon CNM, [place] 02/01/2017 8:04 AM  Agree with above.  Flexeril prn

## 2017-02-01 NOTE — Progress Notes (Addendum)
Karina Long [id number]  Subjective: Strip and Chart Reviewed.  Nurse reports, per note, that patient with scant bleeding after wiping around 2015.   Objective:  Vitals:   01/31/17 0749 01/31/17 1212 01/31/17 1532 01/31/17 2018  BP: 121/73 123/83 119/71 116/75  Pulse: 95 (!) 113 (!) 112 (!) 103  Resp: 18 18 18 16   Temp: 98.6 F (37 C) 98.4 F (36.9 C) 99.2 F (37.3 C) 99 F (37.2 C)  TempSrc: Oral Oral Oral Oral  SpO2: 93% 94% 94% 95%  Weight:      Height:        FHR: 145 bpm, Mod Var, -Decels, +Accels UC: None Graphed  Assessment: IUP at [redacted]w[redacted]d Cat I FT Vaginal Bleeding-Stable   Plan: -Continue current plan of care   Milinda Cave, CNM 02/01/2017 12:04 AM    Addendum ([id number]) Strip Reviewed after nurse calls and reports fetal tracing with minimal variability despite position changes.  States patient given PO flexerall around midnight, but otherwise no changes.  155 bpm, Min to low mod Var, -Decels, -Accels No UC noted  Instructed to give 276mL bolus of LR Continue other mgmt as ordered  Maryann Conners MSN, CNM [phone]

## 2017-02-02 LAB — TYPE AND SCREEN
ABO/RH(D): O POS
ANTIBODY SCREEN: NEGATIVE

## 2017-02-02 NOTE — Progress Notes (Addendum)
Antepartum LOS: [place], 41 y.o.,   OB History    Gravida Para Term Preterm AB Living   4 3 3     3    SAB TAB Ectopic Multiple Live Births           2      Subjective -Patient resting in bed.  Reports some small pink spotting, around 2000, with wiping.  No active bleeding or perception of contractions.  Reports continued back pain and some relief with tylenol and heating pad. No q/c at current.   Objective  Vitals:   02/01/17 1637 02/01/17 1947 02/02/17 0021 02/02/17 0423  BP: 111/66 114/68 115/78 112/74  Pulse: 98 (!) 102 94 83  Resp: 18 18 18 18   Temp: 98.7 F (37.1 C) 98.1 F (36.7 C) 98.6 F (37 C) 98.3 F (36.8 C)  TempSrc: Oral Oral Oral Oral  SpO2: 97% 97% 96% 97%  Weight:      Height:        No results found for this or any previous visit (from the past 24 hour(s)).  Meds: Scheduled Meds: . docusate sodium  100 mg Oral Daily  . NIFEdipine  10 mg Oral Q6H  . prenatal multivitamin  1 tablet Oral Q1200   Continuous Infusions: . lactated ringers 125 mL/hr at 02/02/17 0637   PRN Meds:.acetaminophen, calcium carbonate, cyclobenzaprine, zolpidem   Physical Exam  Constitutional: She is oriented to person, place, and time. She appears well-developed and well-nourished. No distress.  HENT:  Head: Normocephalic and atraumatic.  Eyes: Conjunctivae are normal.  Neck: Normal range of motion.  Cardiovascular: Normal rate, regular rhythm and normal heart sounds.   Pulmonary/Chest: Effort normal and breath sounds normal.  Abdominal: Soft. Bowel sounds are normal.  Gravid--fundal height appears AGA, Soft, NT  Musculoskeletal: Normal range of motion. She exhibits edema.  Neurological: She is alert and oriented to person, place, and time.  Skin: Skin is warm and dry.  Psychiatric: She has a normal mood and affect. Her behavior is normal.  :   Monitoring Type:Continuous Time:0620-0650 FHR: 155 bpm, Mod Var, -Decels, +Accels UC: Mild irritability  occassionally  Assessment IUP at [redacted]w[redacted]d Cat I FT Vaginal Bleeding-Stable S/P BMZ Dosing  Plan Continue current plan of care Upcoming Treatments/Tests: T&S Q 72hrs Dr.EK to follow as appropriate   Maryann Conners, MSN, CNM 02/02/2017, 7:10 AM    I saw and examined patient above and agree with above findings, assessment and plan as per [place].  Patient reports last spotting upon wiping was 02/01/17 at about 2000, she took a picture of it, I reviewed picture- 2 pink globes each on toilet paper, will continue with close monitoring and follow up.  Plan is to keep patient admitted until at least 5 days without any bleeding.  Reviewed NST strip: 02/02/17 @1350 : 150 BL, mod var, Reactive.   TOCO: No contractions.   CVX: deferred.  Dr. Alesia Richards.  02/02/2017 @ 1450.

## 2017-02-03 MED ORDER — SODIUM CHLORIDE 0.9 % IV SOLN: 250.0000 mL | INTRAVENOUS | Status: DC | PRN

## 2017-02-03 MED ORDER — SODIUM CHLORIDE 0.9% FLUSH: 3.0000 mL | INTRAVENOUS | Status: DC | PRN

## 2017-02-03 MED ORDER — SODIUM CHLORIDE 0.9% FLUSH
3.0000 mL | Freq: Two times a day (BID) | INTRAVENOUS | Status: DC
  Administered 2017-02-04 – 2017-02-06 (×6): 3 mL via INTRAVENOUS

## 2017-02-03 NOTE — Progress Notes (Addendum)
Hospital day # 5 pregnancy at [redacted]w[redacted]d--antepartum bleeding.  S:  Pt denies bleeding or contractions. FM + Had BM yesterday.      Perception of contractions: none      Vaginal bleeding: none now       Vaginal discharge: denies  O: BP 116/71 (BP Location: Left Arm)   Pulse 88   Temp 98.3 F (36.8 C) (Oral)   Resp 18   Ht 5\' 3"  (1.6 m)   Wt 68.5 kg (151 lb)   SpO2 97%   BMI 26.75 kg/m       Fetal tracings: Cat 1 strip      Contractions:   None      Uterus gravid and non-tender      Extremities: extremities normal, atraumatic, no cyanosis or edema and no significant edema and no signs of DVT               Meds: Procardia 10 mg every 6  A: [redacted]w[redacted]d with third trimester bleeding     stable  P: Continue current plan of care      Upcoming tests/treatments:  None      MDs will follow [place], MSN 02/03/2017 6:51 AM   Seen and agreed.

## 2017-02-04 NOTE — Progress Notes (Addendum)
Hospital day # 6 pregnancy at [redacted]w[redacted]d--vaginal bleeding in 2nd trimester.  S:  Doing well, no complaints.  FM +      Perception of contractions: none      Vaginal bleeding: none now       Vaginal discharge:  no significant change  O: BP 113/68 (BP Location: Left Arm)   Pulse (!) 103   Temp 98.5 F (36.9 C) (Oral)   Resp 18   Ht 5\' 3"  (1.6 m)   Wt 68.5 kg (151 lb)   SpO2 98%   BMI 26.75 kg/m       Fetal tracings:      Contractions:         Uterus gravid and non-tender      Extremities: extremities normal, atraumatic, no cyanosis or edema and no significant edema and no signs of DVT          Labs:  None       Meds: Procardia 10 mg every 6 hours  A: [redacted]w[redacted]d with vaginal bleeding in 2nd trimester      stable  P: Continue current plan of care      Upcoming tests/treatments:  None      MDs will follow   Starla Link CNM, MSN 02/04/2017 9:47 AM  I reviewed patient's chart, notes and NSTs and agree with above findings, assessment and plan per [place].  Dr. Alesia Richards.

## 2017-02-05 LAB — TYPE AND SCREEN
ABO/RH(D): O POS
Antibody Screen: NEGATIVE

## 2017-02-05 NOTE — Progress Notes (Signed)
Antepartum LOS: 7 Karina Long, 41 y.o.,   OB History    Gravida Para Term Preterm AB Living   4 3 3     3    SAB TAB Ectopic Multiple Live Births           2      Subjective -Patient resting in bed.  Reports some discharge after showering this morning, but nothing with wiping.  No active bleeding or perception of contractions.  Denies back pain and reports active fetus.  No q/c at current.   Objective  Vitals:   02/04/17 1535 02/04/17 2048 02/04/17 2333 02/05/17 0542  BP: 128/79 109/72 114/72 115/68  Pulse: (!) 107 100 91 91  Resp: 18 16 16 16   Temp: 98.2 F (36.8 C) 98.3 F (36.8 C) 98.3 F (36.8 C) 98.5 F (36.9 C)  TempSrc: Oral Oral Oral Oral  SpO2: 99% 98% 97% 97%  Weight:      Height:        Results for orders placed or performed during the hospital encounter of 01/29/17 (from the past 24 hour(s))  Type and screen [place]     Status: None   Collection Time: 02/05/17  5:51 AM  Result Value Ref Range   ABO/RH(D) O POS    Antibody Screen NEG    Sample Expiration 02/08/2017     Meds: Scheduled Meds: . docusate sodium  100 mg Oral Daily  . NIFEdipine  10 mg Oral Q6H  . prenatal multivitamin  1 tablet Oral Q1200  . sodium chloride flush  3 mL Intravenous Q12H   Continuous Infusions: . sodium chloride    . lactated ringers 125 mL/hr at 02/02/17 1812   PRN Meds:.sodium chloride, acetaminophen, calcium carbonate, cyclobenzaprine, sodium chloride flush, zolpidem   Physical Exam  Constitutional: She is oriented to person, place, and time. She appears well-developed and well-nourished. No distress.  HENT:  Head: Normocephalic and atraumatic.  Eyes: EOM are normal.  Neck: Normal range of motion.  Cardiovascular: Normal rate, regular rhythm and normal heart sounds.   Pulmonary/Chest: Effort normal and breath sounds normal.  Abdominal: Soft. Bowel sounds are normal.  Musculoskeletal: Normal range of motion. She exhibits no edema.   Neurological: She is alert and oriented to person, place, and time.  Skin: Skin is warm and dry.  Psychiatric: She has a normal mood and affect. Her behavior is normal.  :   Monitoring Type:NST ZSWF:[id number] FHR: 145 bpm, Mod Var, -Decels, +Accels UC: Occasional Irritability  Assessment IUP at [redacted]w[redacted]d Cat I FT Vaginal Bleeding-Stable  Plan Continue current plan of care Upcoming Treatments/Tests: Dr.SR to follow as appropriate   Karina Conners, MSN, CNM 02/05/2017, 7:17 AM

## 2017-02-06 MED ORDER — NIFEDIPINE 10 MG PO CAPS: 10.0000 mg | ORAL_CAPSULE | Freq: Four times a day (QID) | ORAL | Status: DC | PRN

## 2017-02-06 NOTE — Progress Notes (Signed)
Hospital day # 8 pregnancy at 102w6d  S: well, reports good fetal activity      Contractions:none      Vaginal bleeding:none now       Vaginal discharge: no significant change      Reports no bleeding; no contractions O: BP 107/68 (BP Location: Left Arm)   Pulse 95   Temp 98.9 F (37.2 C) (Oral)   Resp 20   Ht 5\' 3"  (1.6 m)   Wt 151 lb (68.5 kg)   SpO2 98%   BMI 26.75 kg/m       Fetal tracings:reviewed and reassuring      Uterus non-tender      Extremities: no significant edema and no signs of DVT  A: [redacted]w[redacted]d with vaginal bleeeding     stable  P: continue current plan of care Plan for discharge tomorrow Home on Procardia prn  Bertram Gala Anakin Varkey  CNM 02/06/2017 8:42 AM

## 2017-02-07 MED ORDER — NIFEDIPINE 10 MG PO CAPS: 10.0000 mg | ORAL_CAPSULE | Freq: Four times a day (QID) | ORAL | 2 refills | Status: DC | PRN

## 2017-02-07 NOTE — Discharge Instructions (Signed)
Pelvic Rest °Pelvic rest may be recommended if: °· Your placenta is partially or completely covering the opening of your cervix (placenta previa). °· There is bleeding between the wall of the uterus and the amniotic sac in the first trimester of pregnancy (subchorionic hemorrhage). °· You went into labor too early (preterm labor). ° °Based on your overall health and the health of your baby, your health care provider will decide if pelvic rest is right for you. °How do I rest my pelvis? °For as long as told by your health care provider: °· Do not have sex, sexual stimulation, or an orgasm. °· Do not use tampons. Do not douche. Do not put anything in your vagina. °· Do not lift anything that is heavier than 10 lb (4.5 kg). °· Avoid activities that take a lot of effort (are strenuous). °· Avoid any activity in which your pelvic muscles could become strained. ° °When should I seek medical care? °Seek medical care if you have: °· Cramping pain in your lower abdomen. °· Vaginal discharge. °· A low, dull backache. °· Regular contractions. °· Uterine tightening. ° °When should I seek immediate medical care? °Seek immediate medical care if: °· You have vaginal bleeding and you are pregnant. ° °This information is not intended to replace advice given to you by your health care provider. Make sure you discuss any questions you have with your health care provider. °Document Released: 11/01/2010 Document Revised: 12/13/2015 Document Reviewed: 01/08/2015 °Elsevier Interactive Patient Education © 2018 Elsevier Inc. ° °Vaginal Bleeding During Pregnancy, Third Trimester °A small amount of bleeding (spotting) from the vagina is relatively common in pregnancy. Various things can cause bleeding or spotting in pregnancy. Sometimes the bleeding is normal and is not a problem. However, bleeding during the third trimester can also be a sign of something serious for the mother and the baby. Be sure to tell your health care provider about  any vaginal bleeding right away. °Some possible causes of vaginal bleeding during the third trimester include: °· The placenta may be partially or completely covering the opening to the cervix (placenta previa). °· The placenta may have separated from the uterus (abruption of the placenta). °· There may be an infection or growth on the cervix. °· You may be starting labor, called discharging of the mucus plug. °· The placenta may grow into the muscle layer of the uterus (placenta accreta). ° °Follow these instructions at home: °Watch your condition for any changes. The following actions may help to lessen any discomfort you are feeling: °· Follow your health care provider's instructions for limiting your activity. If your health care provider orders bed rest, you may need to stay in bed and only get up to use the bathroom. However, your health care provider may allow you to continue light activity. °· If needed, make plans for someone to help with your regular activities and responsibilities while you are on bed rest. °· Keep track of the number of pads you use each day, how often you change pads, and how soaked (saturated) they are. Write this down. °· Do not use tampons. Do not douche. °· Do not have sexual intercourse or orgasms until approved by your health care provider. °· Follow your health care provider's advice about lifting, driving, and physical activities. °· If you pass any tissue from your vagina, save the tissue so you can show it to your health care provider. °· Only take over-the-counter or prescription medicines as directed by your health care provider. °·   Do not take aspirin because it can make you bleed.  Keep all follow-up appointments as directed by your health care provider.  Contact a health care provider if:  You have any vaginal bleeding during any part of your pregnancy.  You have cramps or labor pains.  You have a fever, not controlled by medicine. Get help right away  if:  You have severe cramps or pain in your back or belly (abdomen).  You have chills.  You have a gush of fluid from the vagina.  You pass large clots or tissue from your vagina.  Your bleeding increases.  You feel light-headed or weak.  You pass out.  You feel less movement or no movement of the baby. This information is not intended to replace advice given to you by your health care provider. Make sure you discuss any questions you have with your health care provider. Document Released: 09/27/2002 Document Revised: 12/13/2015 Document Reviewed: 03/14/2013 Elsevier Interactive Patient Education  2018 [employer].  Preterm Labor and Birth Information The normal length of a pregnancy is 39-41 weeks. Preterm labor is when labor starts before 37 completed weeks of pregnancy. What are the risk factors for preterm labor? Preterm labor is more likely to occur in women who:  Have certain infections during pregnancy such as a bladder infection, sexually transmitted infection, or infection inside the uterus (chorioamnionitis).  Have a shorter-than-normal cervix.  Have gone into preterm labor before.  Have had surgery on their cervix.  Are younger than age 81 or older than age 58.  Are African American.  Are pregnant with twins or multiple babies (multiple gestation).  Take street drugs or smoke while pregnant.  Do not gain enough weight while pregnant.  Became pregnant shortly after having been pregnant.  What are the symptoms of preterm labor? Symptoms of preterm labor include:  Cramps similar to those that can happen during a menstrual period. The cramps may happen with diarrhea.  Pain in the abdomen or lower back.  Regular uterine contractions that may feel like tightening of the abdomen.  A feeling of increased pressure in the pelvis.  Increased watery or bloody mucus discharge from the vagina.  Water breaking (ruptured amniotic sac).  Why is it important to  recognize signs of preterm labor? It is important to recognize signs of preterm labor because babies who are born prematurely may not be fully developed. This can put them at an increased risk for:  Long-term (chronic) heart and lung problems.  Difficulty immediately after birth with regulating body systems, including blood sugar, body temperature, heart rate, and breathing rate.  Bleeding in the brain.  Cerebral palsy.  Learning difficulties.  Death.  These risks are highest for babies who are born before 36 weeks of pregnancy. How is preterm labor treated? Treatment depends on the length of your pregnancy, your condition, and the health of your baby. It may involve:  Having a stitch (suture) placed in your cervix to prevent your cervix from opening too early (cerclage).  Taking or being given medicines, such as: ? Hormone medicines. These may be given early in pregnancy to help support the pregnancy. ? Medicine to stop contractions. ? Medicines to help mature the babys lungs. These may be prescribed if the risk of delivery is high. ? Medicines to prevent your baby from developing cerebral palsy.  If the labor happens before 34 weeks of pregnancy, you may need to stay in the hospital. What should I do if I think I  am in preterm labor? If you think that you are going into preterm labor, call your health care provider right away. How can I prevent preterm labor in future pregnancies? To increase your chance of having a full-term pregnancy:  Do not use any tobacco products, such as cigarettes, chewing tobacco, and e-cigarettes. If you need help quitting, ask your health care provider.  Do not use street drugs or medicines that have not been prescribed to you during your pregnancy.  Talk with your health care provider before taking any herbal supplements, even if you have been taking them regularly.  Make sure you gain a healthy amount of weight during your pregnancy.  Watch  for infection. If you think that you might have an infection, get it checked right away.  Make sure to tell your health care provider if you have gone into preterm labor before.  This information is not intended to replace advice given to you by your health care provider. Make sure you discuss any questions you have with your health care provider. Document Released: 09/27/2003 Document Revised: 12/18/2015 Document Reviewed: 11/28/2015 Elsevier Interactive Patient Education  2018 [employer].

## 2017-02-07 NOTE — Discharge Summary (Signed)
ANTENATAL DISCHARGE SUMMARY  Patient ID: Karina Long MRN: [id number] DOB/AGE: [birth year] 41 y.o.  Admit date: 01/29/2017 Discharge date: 02/07/2017  Admission Diagnoses: 29wks bleeding doc sent over  Discharge Diagnoses: 29wks bleeding doc sent over         Discharged Condition: stable  Hospital Course: Pt has been observed inpatient for vaginal bleeding has no bleeding has occurred. Pt will be discharged home today on Procardia prn and bedrest with follow up in our office next week.  Consults: MFM  Treatments: IV hydration; Procardia; Observation  Disposition: home   Allergies as of 02/07/2017   No Known Allergies     Medication List    TAKE these medications   NIFEdipine 10 MG capsule Commonly known as:  PROCARDIA Take 1 capsule (10 mg total) by mouth every 6 (six) hours as needed (contractions).   prenatal multivitamin Tabs tablet Take 1 tablet by mouth daily at 12 noon.      Follow-up [facility]. Schedule an appointment as soon as possible for a visit.   Specialty:  Obstetrics and Gynecology Why:  Call for follow up appointment next week Contact information: [address] [place] [address] [phone]          Signed: Larey Days, CNM MD 02/07/2017, 11:40 AM  ANTENATAL DISCHARGE INSTRUCTIONS   Date: 02/07/2017   Time: 11:40 AM   PRETERM LABOR: Includes any of the following symptoms that occur between 20-[redacted] weeks gestation. If these symptoms are not stopped, preterm labor can result in preterm delivery, placing your baby at risk.  Notify your doctor if any of the following occur: 1. Menstrual-like cramps   5. Pelvic pressure  2. Uterine contractions. These may be painless and feel like the uterus is tightening or the baby is "balling up" 6. Increase or change in vaginal discharge  3. Low, dull backache, unrelieved by heat or Tylenol  7. Vaginal bleeding  4.  Intestinal cramps, with our without diarrhea, sometimes 8. A general feeling that "something is not right"   9. Leaking of fluid described as "gas pain"    [place]:  Medicine to stop labor (tocolytic)  Fill all the prescriptions ordered by your doctor and be sure to take the   entire dose as directed. Any time you go for emergency treatment, bring   your medicine.  B. WOUND CARE: N/A  C. DIET AT HOME: NORMAL  D. ACTIVITY: Bathroom/shower only  E. SEXUAL ACTIVITY: Do not have sex or do anything that might make you have an orgasm  F. FOLLOW-UP CARE:  Return to: Private Physician [facility]     Referral to [place]: N/A  Phone: N/A  Note: If the agency has not contacted you within one day, you should call them. G. DISCHARGE TEACHING: Pt verbalized understanding of instructions  H. MATERNAL DISCHARGE TO: Home

## 2017-03-20 ENCOUNTER — Encounter (HOSPITAL_COMMUNITY): Payer: Self-pay

## 2017-03-31 ENCOUNTER — Inpatient Hospital Stay (HOSPITAL_COMMUNITY): Admission: AD | Admit: 2017-03-31 | Payer: 59 | Source: Ambulatory Visit | Admitting: Obstetrics and Gynecology

## 2017-03-31 ENCOUNTER — Encounter (HOSPITAL_COMMUNITY): Payer: Self-pay | Admitting: *Deleted

## 2017-03-31 ENCOUNTER — Inpatient Hospital Stay (HOSPITAL_COMMUNITY)
Admission: AD | Admit: 2017-03-31 | Discharge: 2017-04-02 | DRG: 766 | Disposition: A | Discharge status: Home / Self Care | Payer: 59 | Source: Ambulatory Visit | Attending: Obstetrics and Gynecology | Admitting: Obstetrics and Gynecology

## 2017-03-31 ENCOUNTER — Encounter (HOSPITAL_COMMUNITY)
Admission: AD | Disposition: A | Discharge status: Home / Self Care | Payer: Self-pay | Source: Ambulatory Visit | Attending: Obstetrics and Gynecology

## 2017-03-31 ENCOUNTER — Inpatient Hospital Stay (HOSPITAL_COMMUNITY): Payer: 59 | Admitting: Anesthesiology

## 2017-03-31 DIAGNOSIS — Z3A38 38 weeks gestation of pregnancy: Secondary | ICD-10-CM

## 2017-03-31 DIAGNOSIS — L910 Hypertrophic scar: Secondary | ICD-10-CM | POA: Diagnosis present

## 2017-03-31 DIAGNOSIS — O34211 Maternal care for low transverse scar from previous cesarean delivery: Principal | ICD-10-CM | POA: Diagnosis present

## 2017-03-31 DIAGNOSIS — O26893 Other specified pregnancy related conditions, third trimester: Secondary | ICD-10-CM | POA: Diagnosis present

## 2017-03-31 DIAGNOSIS — O99824 Streptococcus B carrier state complicating childbirth: Secondary | ICD-10-CM | POA: Diagnosis present

## 2017-03-31 LAB — CBC
HCT: 38.3 % (ref 36.0–46.0)
Hemoglobin: 13.5 g/dL (ref 12.0–15.0)
MCH: 32.5 pg (ref 26.0–34.0)
MCHC: 35.2 g/dL (ref 30.0–36.0)
MCV: 92.3 fL (ref 78.0–100.0)
PLATELETS: 245 10*3/uL (ref 150–400)
RBC: 4.15 MIL/uL (ref 3.87–5.11)
RDW: 13.4 % (ref 11.5–15.5)
WBC: 7.3 10*3/uL (ref 4.0–10.5)

## 2017-03-31 LAB — TYPE AND SCREEN
ABO/RH(D): O POS
ANTIBODY SCREEN: NEGATIVE

## 2017-03-31 LAB — COMPREHENSIVE METABOLIC PANEL
ALT: 15 U/L (ref 14–54)
ANION GAP: 9 (ref 5–15)
AST: 20 U/L (ref 15–41)
Albumin: 2.8 g/dL — ABNORMAL LOW (ref 3.5–5.0)
Alkaline Phosphatase: 132 U/L — ABNORMAL HIGH (ref 38–126)
BUN: 8 mg/dL (ref 6–20)
CHLORIDE: 107 mmol/L (ref 101–111)
CO2: 20 mmol/L — ABNORMAL LOW (ref 22–32)
CREATININE: 0.54 mg/dL (ref 0.44–1.00)
Calcium: 9.2 mg/dL (ref 8.9–10.3)
Glucose, Bld: 88 mg/dL (ref 65–99)
Potassium: 4 mmol/L (ref 3.5–5.1)
Sodium: 136 mmol/L (ref 135–145)
Total Bilirubin: 0.5 mg/dL (ref 0.3–1.2)
Total Protein: 6.1 g/dL — ABNORMAL LOW (ref 6.5–8.1)

## 2017-03-31 LAB — URIC ACID: URIC ACID, SERUM: 4.5 mg/dL (ref 2.3–6.6)

## 2017-03-31 LAB — LACTATE DEHYDROGENASE: LDH: 110 U/L (ref 98–192)

## 2017-03-31 LAB — PROTEIN / CREATININE RATIO, URINE
Creatinine, Urine: 86 mg/dL
PROTEIN CREATININE RATIO: 0.16 mg/mg{creat} — AB (ref 0.00–0.15)
Total Protein, Urine: 14 mg/dL

## 2017-03-31 LAB — POCT FERN TEST: POCT FERN TEST: POSITIVE

## 2017-03-31 SURGERY — Surgical Case
Anesthesia: Spinal | Wound class: Clean Contaminated

## 2017-03-31 MED ORDER — SOD CITRATE-CITRIC ACID 500-334 MG/5ML PO SOLN
30.0000 mL | Freq: Once | ORAL | Status: AC
  Administered 2017-03-31: 30 mL via ORAL
  Filled 2017-03-31: qty 15

## 2017-03-31 MED ORDER — DIPHENHYDRAMINE HCL 50 MG/ML IJ SOLN: 12.5000 mg | INTRAMUSCULAR | Status: DC | PRN

## 2017-03-31 MED ORDER — SENNOSIDES-DOCUSATE SODIUM 8.6-50 MG PO TABS
2.0000 | ORAL_TABLET | ORAL | Status: DC
  Administered 2017-03-31: 2 via ORAL
  Filled 2017-03-31 (×3): qty 2

## 2017-03-31 MED ORDER — KETOROLAC TROMETHAMINE 30 MG/ML IJ SOLN: 30.0000 mg | Freq: Four times a day (QID) | INTRAMUSCULAR | Status: AC | PRN

## 2017-03-31 MED ORDER — LACTATED RINGERS IV SOLN
INTRAVENOUS | Status: DC
  Administered 2017-03-31 (×3): via INTRAVENOUS

## 2017-03-31 MED ORDER — WITCH HAZEL-GLYCERIN EX PADS: 1.0000 "application " | MEDICATED_PAD | CUTANEOUS | Status: DC | PRN

## 2017-03-31 MED ORDER — MORPHINE SULFATE (PF) 0.5 MG/ML IJ SOLN
INTRAMUSCULAR | Status: DC | PRN
  Administered 2017-03-31: .2 mg via INTRATHECAL

## 2017-03-31 MED ORDER — ONDANSETRON HCL 4 MG/2ML IJ SOLN
INTRAMUSCULAR | Status: AC
  Filled 2017-03-31: qty 2

## 2017-03-31 MED ORDER — NALBUPHINE HCL 10 MG/ML IJ SOLN: 5.0000 mg | Freq: Once | INTRAMUSCULAR | Status: DC | PRN

## 2017-03-31 MED ORDER — BUPIVACAINE IN DEXTROSE 0.75-8.25 % IT SOLN
INTRATHECAL | Status: DC | PRN
  Administered 2017-03-31: 1.6 mL via INTRATHECAL

## 2017-03-31 MED ORDER — NALOXONE HCL 0.4 MG/ML IJ SOLN: 0.4000 mg | INTRAMUSCULAR | Status: DC | PRN

## 2017-03-31 MED ORDER — SIMETHICONE 80 MG PO CHEW
80.0000 mg | CHEWABLE_TABLET | ORAL | Status: DC
  Administered 2017-03-31 – 2017-04-02 (×2): 80 mg via ORAL
  Filled 2017-03-31 (×4): qty 1

## 2017-03-31 MED ORDER — TERBUTALINE SULFATE 1 MG/ML IJ SOLN
0.2500 mg | Freq: Once | INTRAMUSCULAR | Status: AC
  Administered 2017-03-31: 0.25 mg via SUBCUTANEOUS
  Filled 2017-03-31: qty 1

## 2017-03-31 MED ORDER — ENOXAPARIN SODIUM 40 MG/0.4ML ~~LOC~~ SOLN
40.0000 mg | SUBCUTANEOUS | Status: DC
  Administered 2017-04-01: 40 mg via SUBCUTANEOUS
  Filled 2017-03-31 (×2): qty 0.4

## 2017-03-31 MED ORDER — METOCLOPRAMIDE HCL 5 MG/ML IJ SOLN
INTRAMUSCULAR | Status: DC | PRN
  Administered 2017-03-31: 10 mg via INTRAVENOUS

## 2017-03-31 MED ORDER — CEFAZOLIN SODIUM-DEXTROSE 2-4 GM/100ML-% IV SOLN
2.0000 g | Freq: Once | INTRAVENOUS | Status: AC
  Administered 2017-03-31: 2 g via INTRAVENOUS
  Filled 2017-03-31: qty 100

## 2017-03-31 MED ORDER — PHENYLEPHRINE 40 MCG/ML (10ML) SYRINGE FOR IV PUSH (FOR BLOOD PRESSURE SUPPORT)
PREFILLED_SYRINGE | INTRAVENOUS | Status: AC
  Filled 2017-03-31: qty 10

## 2017-03-31 MED ORDER — KETOROLAC TROMETHAMINE 30 MG/ML IJ SOLN
30.0000 mg | Freq: Four times a day (QID) | INTRAMUSCULAR | Status: AC | PRN
  Administered 2017-03-31: 30 mg via INTRAMUSCULAR

## 2017-03-31 MED ORDER — KETOROLAC TROMETHAMINE 30 MG/ML IJ SOLN
INTRAMUSCULAR | Status: AC
  Filled 2017-03-31: qty 1

## 2017-03-31 MED ORDER — TETANUS-DIPHTH-ACELL PERTUSSIS 5-2.5-18.5 LF-MCG/0.5 IM SUSP
0.5000 mL | Freq: Once | INTRAMUSCULAR | Status: DC
  Filled 2017-03-31: qty 0.5

## 2017-03-31 MED ORDER — SIMETHICONE 80 MG PO CHEW
80.0000 mg | CHEWABLE_TABLET | Freq: Three times a day (TID) | ORAL | Status: DC
  Administered 2017-03-31 – 2017-04-02 (×5): 80 mg via ORAL
  Filled 2017-03-31 (×11): qty 1

## 2017-03-31 MED ORDER — DIBUCAINE 1 % RE OINT
1.0000 "application " | TOPICAL_OINTMENT | RECTAL | Status: DC | PRN
  Filled 2017-03-31: qty 28

## 2017-03-31 MED ORDER — NALBUPHINE HCL 10 MG/ML IJ SOLN: 5.0000 mg | INTRAMUSCULAR | Status: DC | PRN

## 2017-03-31 MED ORDER — SCOPOLAMINE 1 MG/3DAYS TD PT72: 1.0000 | MEDICATED_PATCH | Freq: Once | TRANSDERMAL | Status: DC

## 2017-03-31 MED ORDER — PHENYLEPHRINE 8 MG IN D5W 100 ML (0.08MG/ML) PREMIX OPTIME
INJECTION | INTRAVENOUS | Status: AC
  Filled 2017-03-31: qty 100

## 2017-03-31 MED ORDER — LACTATED RINGERS IV SOLN: INTRAVENOUS | Status: DC

## 2017-03-31 MED ORDER — MORPHINE SULFATE (PF) 0.5 MG/ML IJ SOLN
INTRAMUSCULAR | Status: AC
  Filled 2017-03-31: qty 10

## 2017-03-31 MED ORDER — DEXAMETHASONE SODIUM PHOSPHATE 4 MG/ML IJ SOLN
INTRAMUSCULAR | Status: DC | PRN
  Administered 2017-03-31: 4 mg via INTRAVENOUS

## 2017-03-31 MED ORDER — LACTATED RINGERS IV SOLN
INTRAVENOUS | Status: DC | PRN
  Administered 2017-03-31: 09:00:00 via INTRAVENOUS

## 2017-03-31 MED ORDER — PROMETHAZINE HCL 25 MG/ML IJ SOLN: 6.2500 mg | INTRAMUSCULAR | Status: DC | PRN

## 2017-03-31 MED ORDER — MEPERIDINE HCL 25 MG/ML IJ SOLN: 6.2500 mg | INTRAMUSCULAR | Status: DC | PRN

## 2017-03-31 MED ORDER — MEDROXYPROGESTERONE ACETATE 150 MG/ML IM SUSP: 150.0000 mg | INTRAMUSCULAR | Status: DC | PRN

## 2017-03-31 MED ORDER — FENTANYL CITRATE (PF) 100 MCG/2ML IJ SOLN
INTRAMUSCULAR | Status: DC | PRN
  Administered 2017-03-31: 10 ug via INTRATHECAL

## 2017-03-31 MED ORDER — FENTANYL CITRATE (PF) 100 MCG/2ML IJ SOLN: 25.0000 ug | INTRAMUSCULAR | Status: DC | PRN

## 2017-03-31 MED ORDER — OXYTOCIN 40 UNITS IN LACTATED RINGERS INFUSION - SIMPLE MED: 2.5000 [IU]/h | INTRAVENOUS | Status: AC

## 2017-03-31 MED ORDER — ONDANSETRON HCL 4 MG/2ML IJ SOLN
INTRAMUSCULAR | Status: DC | PRN
  Administered 2017-03-31: 4 mg via INTRAVENOUS

## 2017-03-31 MED ORDER — MEPERIDINE HCL 25 MG/ML IJ SOLN
INTRAMUSCULAR | Status: AC
  Filled 2017-03-31: qty 1

## 2017-03-31 MED ORDER — BUPIVACAINE-EPINEPHRINE (PF) 0.5% -1:200000 IJ SOLN
INTRAMUSCULAR | Status: AC
  Filled 2017-03-31: qty 30

## 2017-03-31 MED ORDER — ACETAMINOPHEN 325 MG PO TABS: 650.0000 mg | ORAL_TABLET | ORAL | Status: DC | PRN

## 2017-03-31 MED ORDER — SIMETHICONE 80 MG PO CHEW
80.0000 mg | CHEWABLE_TABLET | ORAL | Status: DC | PRN
  Filled 2017-03-31: qty 1

## 2017-03-31 MED ORDER — DEXAMETHASONE SODIUM PHOSPHATE 4 MG/ML IJ SOLN
INTRAMUSCULAR | Status: AC
  Filled 2017-03-31: qty 1

## 2017-03-31 MED ORDER — PHENYLEPHRINE 8 MG IN D5W 100 ML (0.08MG/ML) PREMIX OPTIME
INJECTION | INTRAVENOUS | Status: DC | PRN
  Administered 2017-03-31: 60 ug/min via INTRAVENOUS

## 2017-03-31 MED ORDER — DIPHENHYDRAMINE HCL 25 MG PO CAPS
25.0000 mg | ORAL_CAPSULE | Freq: Four times a day (QID) | ORAL | Status: DC | PRN
  Filled 2017-03-31: qty 1

## 2017-03-31 MED ORDER — SODIUM CHLORIDE 0.9% FLUSH: 3.0000 mL | INTRAVENOUS | Status: DC | PRN

## 2017-03-31 MED ORDER — OXYTOCIN 10 UNIT/ML IJ SOLN
INTRAMUSCULAR | Status: AC
  Filled 2017-03-31: qty 4

## 2017-03-31 MED ORDER — ACETAMINOPHEN 500 MG PO TABS
1000.0000 mg | ORAL_TABLET | Freq: Four times a day (QID) | ORAL | Status: AC
  Administered 2017-03-31 – 2017-04-01 (×3): 1000 mg via ORAL
  Filled 2017-03-31 (×3): qty 2

## 2017-03-31 MED ORDER — COCONUT OIL OIL
1.0000 "application " | TOPICAL_OIL | Status: DC | PRN
  Administered 2017-04-01: 1 via TOPICAL
  Filled 2017-03-31 (×2): qty 120

## 2017-03-31 MED ORDER — DIPHENHYDRAMINE HCL 25 MG PO CAPS
25.0000 mg | ORAL_CAPSULE | ORAL | Status: DC | PRN
  Filled 2017-03-31: qty 1

## 2017-03-31 MED ORDER — OXYTOCIN 10 UNIT/ML IJ SOLN
INTRAVENOUS | Status: DC | PRN
  Administered 2017-03-31: 40 [IU] via INTRAVENOUS

## 2017-03-31 MED ORDER — PRENATAL MULTIVITAMIN CH
1.0000 | ORAL_TABLET | Freq: Every day | ORAL | Status: DC
  Administered 2017-04-01: 1 via ORAL
  Filled 2017-03-31 (×3): qty 1

## 2017-03-31 MED ORDER — NALOXONE HCL 2 MG/2ML IJ SOSY
1.0000 ug/kg/h | PREFILLED_SYRINGE | INTRAMUSCULAR | Status: DC | PRN
  Filled 2017-03-31: qty 2

## 2017-03-31 MED ORDER — MEASLES, MUMPS & RUBELLA VAC ~~LOC~~ INJ
0.5000 mL | INJECTION | Freq: Once | SUBCUTANEOUS | Status: DC
  Filled 2017-03-31: qty 0.5

## 2017-03-31 MED ORDER — SCOPOLAMINE 1 MG/3DAYS TD PT72
MEDICATED_PATCH | TRANSDERMAL | Status: DC | PRN
  Administered 2017-03-31: 1 via TRANSDERMAL

## 2017-03-31 MED ORDER — FENTANYL CITRATE (PF) 100 MCG/2ML IJ SOLN
INTRAMUSCULAR | Status: AC
  Filled 2017-03-31: qty 2

## 2017-03-31 MED ORDER — ZOLPIDEM TARTRATE 5 MG PO TABS: 5.0000 mg | ORAL_TABLET | Freq: Every evening | ORAL | Status: DC | PRN

## 2017-03-31 MED ORDER — BUPIVACAINE IN DEXTROSE 0.75-8.25 % IT SOLN
INTRATHECAL | Status: AC
  Filled 2017-03-31: qty 2

## 2017-03-31 MED ORDER — FAMOTIDINE IN NACL 20-0.9 MG/50ML-% IV SOLN
20.0000 mg | Freq: Once | INTRAVENOUS | Status: AC
  Administered 2017-03-31: 20 mg via INTRAVENOUS
  Filled 2017-03-31: qty 50

## 2017-03-31 MED ORDER — MENTHOL 3 MG MT LOZG
1.0000 | LOZENGE | OROMUCOSAL | Status: DC | PRN
  Filled 2017-03-31: qty 9

## 2017-03-31 MED ORDER — ONDANSETRON HCL 4 MG/2ML IJ SOLN: 4.0000 mg | Freq: Three times a day (TID) | INTRAMUSCULAR | Status: DC | PRN

## 2017-03-31 MED ORDER — IBUPROFEN 600 MG PO TABS
600.0000 mg | ORAL_TABLET | Freq: Four times a day (QID) | ORAL | Status: DC
  Administered 2017-03-31 – 2017-04-02 (×7): 600 mg via ORAL
  Filled 2017-03-31 (×7): qty 1

## 2017-03-31 MED ORDER — MEPERIDINE HCL 25 MG/ML IJ SOLN
INTRAMUSCULAR | Status: DC | PRN
  Administered 2017-03-31 (×2): 12.5 mg via INTRAVENOUS

## 2017-03-31 MED ORDER — HYDROMORPHONE HCL 2 MG PO TABS: 2.0000 mg | ORAL_TABLET | ORAL | Status: DC | PRN

## 2017-03-31 MED ORDER — BUPIVACAINE-EPINEPHRINE 0.5% -1:200000 IJ SOLN
INTRAMUSCULAR | Status: DC | PRN
  Administered 2017-03-31: 6 mL
  Administered 2017-03-31: 10 mL

## 2017-03-31 SURGICAL SUPPLY — 40 items
CHLORAPREP W/TINT 26ML (MISCELLANEOUS) ×3 IMPLANT
CLAMP CORD UMBIL (MISCELLANEOUS) IMPLANT
CLOTH BEACON ORANGE TIMEOUT ST (SAFETY) ×3 IMPLANT
CONTAINER PREFILL 10% NBF 15ML (MISCELLANEOUS) IMPLANT
DRAIN JACKSON PRT FLT 7MM (DRAIN) IMPLANT
DRSG OPSITE POSTOP 4X10 (GAUZE/BANDAGES/DRESSINGS) ×3 IMPLANT
ELECT REM PT RETURN 9FT ADLT (ELECTROSURGICAL) ×3
ELECTRODE REM PT RTRN 9FT ADLT (ELECTROSURGICAL) ×1 IMPLANT
EVACUATOR SILICONE 100CC (DRAIN) IMPLANT
EXTRACTOR VACUUM M CUP 4 TUBE (SUCTIONS) IMPLANT
EXTRACTOR VACUUM M CUP 4' TUBE (SUCTIONS)
GAUZE SPONGE 4X4 12PLY STRL LF (GAUZE/BANDAGES/DRESSINGS) ×6 IMPLANT
GLOVE BIOGEL PI IND STRL 7.0 (GLOVE) ×1 IMPLANT
GLOVE BIOGEL PI IND STRL 8.5 (GLOVE) ×1 IMPLANT
GLOVE BIOGEL PI INDICATOR 7.0 (GLOVE) ×2
GLOVE BIOGEL PI INDICATOR 8.5 (GLOVE) ×2
GLOVE ECLIPSE 8.0 STRL XLNG CF (GLOVE) ×6 IMPLANT
GOWN STRL REUS W/TWL LRG LVL3 (GOWN DISPOSABLE) ×9 IMPLANT
KIT ABG SYR 3ML LUER SLIP (SYRINGE) IMPLANT
NEEDLE HYPO 22GX1.5 SAFETY (NEEDLE) ×3 IMPLANT
NEEDLE HYPO 25X5/8 SAFETYGLIDE (NEEDLE) IMPLANT
PACK C SECTION WH (CUSTOM PROCEDURE TRAY) ×3 IMPLANT
PAD ABD 7.5X8 STRL (GAUZE/BANDAGES/DRESSINGS) ×3 IMPLANT
PAD OB MATERNITY 4.3X12.25 (PERSONAL CARE ITEMS) ×3 IMPLANT
PENCIL SMOKE EVAC W/HOLSTER (ELECTROSURGICAL) ×3 IMPLANT
RINGERS IRRIG 1000ML POUR BTL (IV SOLUTION) ×3 IMPLANT
SUT MNCRL AB 3-0 PS2 27 (SUTURE) ×3 IMPLANT
SUT PLAIN 0 NONE (SUTURE) IMPLANT
SUT PLAIN 2 0 XLH (SUTURE) ×3 IMPLANT
SUT SILK 3 0 FS 1X18 (SUTURE) IMPLANT
SUT VIC AB 0 CT1 27 (SUTURE) ×4
SUT VIC AB 0 CT1 27XBRD ANBCTR (SUTURE) ×2 IMPLANT
SUT VIC AB 2-0 CTX 36 (SUTURE) ×6 IMPLANT
SUT VIC AB 3-0 CT1 27 (SUTURE)
SUT VIC AB 3-0 CT1 TAPERPNT 27 (SUTURE) IMPLANT
SUT VIC AB 3-0 SH 27 (SUTURE)
SUT VIC AB 3-0 SH 27X BRD (SUTURE) IMPLANT
SYR CONTROL 10ML LL (SYRINGE) ×3 IMPLANT
TOWEL OR 17X24 6PK STRL BLUE (TOWEL DISPOSABLE) ×3 IMPLANT
TRAY FOLEY BAG SILVER LF 14FR (SET/KITS/TRAYS/PACK) ×3 IMPLANT

## 2017-03-31 NOTE — MAU Note (Signed)
Pt reports to MAU c/o SROM that occurred at 0600 pt reports clear fluid. Pt denies bleeding and reports good fetal movement.

## 2017-03-31 NOTE — Op Note (Signed)
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  Assistant:  Donnel Saxon, certified nurse midwife  Anesthesia:  Spinal  Disposition:  Karina Long is a 41 y.o. female, [redacted]w[redacted]d, who presents at [redacted]w[redacted]d weeks gestation. The patient has been followed at the [facility] obstetrics and gynecology division of [place] [facility]. This pregnancy has been complicated by a prior cesarean section. The patient desires a repeat cesarean section. She understands the indications for her procedure and she accepts the risk of, but not limited to, anesthetic complications, bleeding, infections, and possible damage to the surrounding organs.  Findings:  A  female Alphonzo Cruise) was delivered from a left occiput transverse position.  The Apgar scores were 9/9. The uterus, fallopian tubes, and ovaries were normal for the gravid state.  Procedure:  The patient was taken to the operating room where a spinal anesthetic was given.The perineum was prepped with betadine. A Foley catheter was placed in the bladder.The patient's abdomen was prepped with Duraprep.   The patient was sterilely draped. A "timeout" was performed which properly identified the patient and the correct operative procedure. The lower abdomen was injected with half percent Marcaine with epinephrine. A low transverse incision was made in the abdomen (removing the previous keloid scar) and carried sharply  through the subcutaneous tissue, the fascia, and the anterior peritoneum. An incision was made in the lower uterine segment. The incision was extended in a low transverse fashion. The membranes were ruptured. The fetal head was delivered without difficulty. The mouth and nose were suctioned. The remainder of the infant was then delivered. The cord was clamped and cut. The infant was handed to the awaiting pediatric team. The placenta was removed. The uterine cavity was cleaned of amniotic fluid, clotted blood, and membranes. The uterine incision was closed using a running locking suture of 2-0 Vicryl. An imbricating suture of 2-0 Vicryl was placed. The pelvis was vigorously irrigated. Hemostasis was adequate. The anterior peritoneum and the abdominal musculature were closed using 2-0 Vicryl. The fascia was closed using a running suture of 0 Vicryl followed by 3 interrupted sutures of 0 Vicryl. The subcutaneous layer was closed using interrupted sutures of 2-0 plain catgut suture. The skin was reapproximated using a subcuticular suture of 3-0 Monocryl. Sponge, needle, and instrument counts were correct on 2 occasions. The estimated blood loss for the procedure was approximately 375 cc. The patient tolerated her procedure well. She was transported to the recovery room in stable condition. The infant remained in the operating room with the mother for bonding. The placenta was sent to labor and delivery.  Gildardo Cranker M.D. 03/31/2017

## 2017-03-31 NOTE — Transfer of Care (Signed)
Immediate Anesthesia Transfer of Care Note  Patient: Karina Long  Procedure(s) Performed: Procedure(s) with comments: REPEAT CESAREAN SECTION (N/A) - Provider requests RNFA, Please  Patient Location: PACU  Anesthesia Type:Spinal  Level of Consciousness: awake, alert  and oriented  Airway & Oxygen Therapy: Patient Spontanous Breathing  Post-op Assessment: Report given to RN and Post -op Vital signs reviewed and stable  Post vital signs: Reviewed and stable  Last Vitals:  Vitals:   03/31/17 0800 03/31/17 0815  BP: 127/87 (!) 145/84  Pulse: (!) 105 (!) 119  Resp:    Temp:    SpO2:      Last Pain:  Vitals:   03/31/17 0731  TempSrc: Oral  PainSc:          Complications: No apparent anesthesia complications

## 2017-03-31 NOTE — H&P (Signed)
Karina Long is a 41 y.o. female, [id number] at 52 3/7 weeks, presenting with SROM at 0600, clear fluid, and contractions. Denies HA, visual sx, or epigastric pain.  Scheduled for repeat C/S next week.  Patient Active Problem List   Diagnosis Date Noted  . Maternal age 55+, multigravida, antepartum 01/31/2017  . Vaginal bleeding in pregnancy, third trimester 01/29/2017  . History of cesarean section 09/17/2016  . 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 Hx of post-sterilization tuboplasty 09/17/2016    History of present pregnancy: Entered care at 10 3/7 weeks.  Planned repeat C/S.  US showed intramural fibroid at 4.9 cm.  Episode of vaginal bleeding at 29 5/7 weeks, admitted for observation, received betamethasone.  EFW at 33 weeks 41%ile, with lagging BPD at 12% and HC 9%.  Followed with BPPs.  Repeat US at 35 weeks showed normal growth parameters.  No further bleeding occurred.  GBS positive.  Scheduled for C/S   OB History    Gravida Para Term Preterm AB Living   6 3 3   2 3    SAB TAB Ectopic Multiple Live Births   2       3            2     # Outcome Date GA Lbr Len/2nd Weight Sex Delivery Anes PTL Lv  4 Current           3 Term 07/17/05    F CS-LTranv Spinal    2 Term 12/28/00    M CS-LTranv   LIV     Complications: Prolapsed cord  1 Term 11/25/96    M  EPI  LIV  2 SABs since last delivery.   Past Medical History:  Diagnosis Date  . Fibroid    uterine   Past Surgical History:  Procedure Laterality Date  . CESAREAN SECTION    . CHOLECYSTECTOMY    . LAPAROSCOPY    . tubal reversal     Family History: Non contributory  Social History:  reports that she has never smoked. She has never used smokeless tobacco. She reports that she does not drink alcohol or use drugs.  She is [place] [employer], married, with husband supportive.  She is employed full time.   Prenatal Transfer Tool  Maternal Diabetes: No Genetic Screening:  Normal Maternal Ultrasounds/Referrals: Normal Fetal Ultrasounds or other Referrals:  None Maternal Substance Abuse:  No Significant Maternal Medications:  None Significant Maternal Lab Results: Lab values include: Group B Strep positive   ROS:  Leaking clear fluid, contractions, positive FM  No Known Allergies   Dilation: 1 Effacement (%): Thick Station: -2 Exam by:: Donnamae Jude CNM  There were no vitals taken for this visit.  Chest clear Heart RRR without murmur Abd gravid, NT, FH 38 weeks Pelvic: Leaking clear fluid, cervix 1 cm, 50%, vtx, -2 Ext: WNL  FHR: Category 1 UCs:  q 6 min  Prenatal labs: ABO, Rh: --/--/O POS (07/19 0551) Antibody: NEG (07/19 0551) Rubella:  Immune RPR: Nonreactive (02/22 0000)  HBsAg: Negative (02/22 0000)  HIV: Non-reactive (02/22 0000)  GBS: Positive (07/12 0000)  Hgb 12.4 at NOB, 12.9 at 28 weeks. Normal glucola Normal panorama and AFP    Assessment/Plan: IUP at 38 3/7 weeks SROM at 6am Previous C/S x 2, desires repeat, no tubal. Hx tubal reversal GBS positive AMA--normal testing Third trimester bleeding--received betamethasone at 29 weeks Mild elevation of BP  Plan: Admit to [facility] per consult with Dr. Raphael Gibney, for repeat  C/S Routine [facility] pre-op orders Check PIH labs, send PCR from foley cath. Family plans inpatient circumcision for baby boy Alphonzo Cruise.  [place], [place], [place] 03/31/2017, 7:21 AM

## 2017-03-31 NOTE — Anesthesia Preprocedure Evaluation (Addendum)
Anesthesia Evaluation  Patient identified by MRN, date of birth, ID band Patient awake    Reviewed: Allergy & Precautions, NPO status , Patient's Chart, lab work & pertinent test results  Airway Mallampati: III  TM Distance: >3 FB Neck ROM: Full  Mouth opening: Limited Mouth Opening  Dental  (+) Dental Advisory Given, Teeth Intact   Pulmonary neg pulmonary ROS,    Pulmonary exam normal breath sounds clear to auscultation       Cardiovascular negative cardio ROS Normal cardiovascular exam Rhythm:Regular Rate:Normal     Neuro/Psych negative neurological ROS  negative psych ROS   GI/Hepatic negative GI ROS, Neg liver ROS,   Endo/Other  negative endocrine ROS  Renal/GU negative Renal ROS  negative genitourinary   Musculoskeletal negative musculoskeletal ROS (+)   Abdominal   Peds  Hematology negative hematology ROS (+)   Anesthesia Other Findings   Reproductive/Obstetrics (+) Pregnancy                            Anesthesia Physical Anesthesia Plan  ASA: II  Anesthesia Plan: Spinal   Post-op Pain Management:    Induction:   PONV Risk Score and Plan: Treatment may vary due to age or medical condition  Airway Management Planned: Natural Airway and Nasal Cannula  Additional Equipment: None  Intra-op Plan:   Post-operative Plan:   Informed Consent: I have reviewed the patients History and Physical, chart, labs and discussed the procedure including the risks, benefits and alternatives for the proposed anesthesia with the patient or authorized representative who has indicated his/her understanding and acceptance.   Dental advisory given  Plan Discussed with: CRNA  Anesthesia Plan Comments:         Anesthesia Quick Evaluation

## 2017-03-31 NOTE — Addendum Note (Signed)
Addendum  created 03/31/17 1435 by Jonna Munro, CRNA   Sign clinical note

## 2017-03-31 NOTE — Anesthesia Procedure Notes (Signed)
Spinal  Patient location during procedure: OR Start time: 03/31/2017 8:31 AM End time: 03/31/2017 8:35 AM Staffing Anesthesiologist: Renold Don E Performed: anesthesiologist  Preanesthetic Checklist Completed: patient identified, surgical consent, pre-op evaluation, timeout performed, IV checked, risks and benefits discussed and monitors and equipment checked Spinal Block Patient position: sitting Prep: DuraPrep Patient monitoring: heart rate, cardiac monitor, continuous pulse ox and blood pressure Approach: midline Location: L3-4 Injection technique: single-shot Needle Needle type: Pencan  Needle gauge: 24 G Additional Notes Functioning IV was confirmed and monitors were applied. Sterile prep and drape, including hand hygiene, mask, and sterile gloves were used. The patient was positioned and the spine was prepped. The skin was anesthetized with lidocaine. Free flow of clear CSF was obtained prior to injecting local anesthetic into the CSF. The spinal needle aspirated freely following injection. The needle was carefully withdrawn. The patient tolerated the procedure well. Consent was obtained prior to the procedure with all questions answered and concerns addressed. Risks including, but not limited to, bleeding, infection, nerve damage, paralysis, failed block, inadequate analgesia, allergic reaction, high spinal, itching, and headache were discussed and the patient wished to proceed.  Renold Don, MD

## 2017-03-31 NOTE — Anesthesia Postprocedure Evaluation (Signed)
Anesthesia Post Note  Patient: Karina Long  Procedure(s) Performed: Procedure(s) (LRB): REPEAT CESAREAN SECTION (N/A)     Patient location during evaluation: PACU Anesthesia Type: Spinal Level of consciousness: oriented and awake and alert Pain management: pain level controlled Vital Signs Assessment: post-procedure vital signs reviewed and stable Respiratory status: spontaneous breathing and respiratory function stable Cardiovascular status: blood pressure returned to baseline and stable Postop Assessment: no headache, no backache and spinal receding Anesthetic complications: no    Last Vitals:  Vitals:   03/31/17 1100 03/31/17 1119  BP:  126/81  Pulse: 95 92  Resp: 16 16  Temp:  37.4 C  SpO2: 99% 98%    Last Pain:  Vitals:   03/31/17 1119  TempSrc: Oral  PainSc:    Pain Goal:                 Audry Pili

## 2017-03-31 NOTE — Progress Notes (Signed)
The patient was interviewed and examined today.  The previously documented history and physical examination was reviewed. There are no changes. The operative procedure was reviewed. The risks and benefits were outlined again. The specific risks include, but are not limited to, anesthetic complications, bleeding, infections, and possible damage to the surrounding organs. The patient's questions were answered.  We are ready to proceed as outlined. The likelihood of the patient achieving the goals of this procedure is very likely.   Seri Kimmer Vernon Domingue Coltrain, M.D.  

## 2017-03-31 NOTE — Anesthesia Postprocedure Evaluation (Signed)
Anesthesia Post Note  Patient: Karina Long  Procedure(s) Performed: Procedure(s) (LRB): REPEAT CESAREAN SECTION (N/A)     Patient location during evaluation: Mother Baby Anesthesia Type: Spinal Level of consciousness: awake and alert and oriented Pain management: satisfactory to patient Vital Signs Assessment: post-procedure vital signs reviewed and stable Respiratory status: spontaneous breathing and nonlabored ventilation Cardiovascular status: stable Postop Assessment: no headache, no backache, patient able to bend at knees, no signs of nausea or vomiting and adequate PO intake Anesthetic complications: no    Last Vitals:  Vitals:   03/31/17 1220 03/31/17 1345  BP: 127/80 121/75  Pulse: 91 85  Resp: 20 18  Temp: 37.1 C 36.8 C  SpO2: 97%     Last Pain:  Vitals:   03/31/17 1345  TempSrc: Oral  PainSc: 0-No pain   Pain Goal:                 [name]

## 2017-04-01 LAB — CBC
HCT: 30.8 % — ABNORMAL LOW (ref 36.0–46.0)
Hemoglobin: 10.9 g/dL — ABNORMAL LOW (ref 12.0–15.0)
MCH: 32.8 pg (ref 26.0–34.0)
MCHC: 35.4 g/dL (ref 30.0–36.0)
MCV: 92.8 fL (ref 78.0–100.0)
PLATELETS: 211 10*3/uL (ref 150–400)
RBC: 3.32 MIL/uL — AB (ref 3.87–5.11)
RDW: 13.4 % (ref 11.5–15.5)
WBC: 10.5 10*3/uL (ref 4.0–10.5)

## 2017-04-01 LAB — BIRTH TISSUE RECOVERY COLLECTION (PLACENTA DONATION)

## 2017-04-01 LAB — RPR: RPR Ser Ql: NONREACTIVE

## 2017-04-01 MED ORDER — OXYCODONE HCL 5 MG PO TABS
5.0000 mg | ORAL_TABLET | ORAL | Status: DC | PRN
  Administered 2017-04-01 – 2017-04-02 (×3): 5 mg via ORAL
  Filled 2017-04-01 (×2): qty 1

## 2017-04-01 MED ORDER — OXYCODONE HCL 5 MG PO TABS
10.0000 mg | ORAL_TABLET | ORAL | Status: DC | PRN
  Filled 2017-04-01: qty 2

## 2017-04-01 NOTE — Progress Notes (Signed)
Subjective: Postpartum Day 1: Cesarean Delivery Patient reports no complaints.  Objective: Vital signs in last 24 hours: Temp:  [98 F (36.7 C)-98.4 F (36.9 C)] 98.4 F (36.9 C) (09/12 0839) Pulse Rate:  [70-85] 85 (09/12 0839) Resp:  [16-20] 20 (09/12 0839) BP: (100-117)/(60-80) 117/80 (09/12 0839) SpO2:  [95 %-98 %] 98 % (09/12 0839)  Physical Exam:  General: alert  Lochia: appropriate Uterine Fundus: non-tender Incision: n/a DVT Evaluation: no calf tenderness   Recent Labs  03/31/17 0710 04/01/17 0517  HGB 13.5 10.9*  HCT 38.3 30.8*    Assessment/Plan: Status post Cesarean section. Doing well. Cont post op care Scds for DVT prophylaxis IS  Oneida Mckamey Y 04/01/2017, 2:53 PM

## 2017-04-01 NOTE — Lactation Note (Signed)
This note was copied from a baby's chart. Lactation Consultation Note  Patient Name: Karina Long SUORV'I Date: 04/01/2017 Reason for consult: Follow-up assessment Baby at 37 hr of life. Mom called for help latching baby. Upon entry baby was sleeping in mom's arms. Mom reports "pinching nipples". Left lactation phone number on the white board for mom to call at the next feeding for help getting a deeper latch. Reviewed bf positioning, jaw massage, and tummy time. While lactation was talking with mom the RN came in the room with formula. Talked with parents about the risks of formula. Suggested they use a spoon, cup, or oral syringe instead of the slow flow nipple, parents were agreeable to spoon feeding. Parents were given the written guidelines for formula. Parents are aware of lactation services and support group.    Maternal Data    Feeding Length of feed: 20 min  LATCH Score                   Interventions    Lactation Tools Discussed/Used     Consult Status Consult Status: Follow-up Date: 04/02/17 Follow-up type: In-patient    Denzil Hughes 04/01/2017, 10:54 PM

## 2017-04-01 NOTE — Lactation Note (Signed)
This note was copied from a baby's chart. Lactation Consultation Note  Patient Name: Karina Long [id number]'D Date: 04/01/2017 Reason for consult: Initial assessment;Difficult latch     Follow up with mom of 61 hour old infant. Infant with 3 BF for 10-28 minutes, 7 attempts, 3 voids and 2 stools in last 24 hours. LATCH scores 6-7. Infant weight 6 lb 0.7oz with weight loss of 3% since birth.   Mom does not feel BF is going well. She reports her 3 older children would not latch and she pumped and bottle fed for 4-6 weeks. Mom is wearing breast shells and has a NS in the room to try with next feeding. Mom is to call out when infant awakens to feed. Infant currently asleep in crib. Mom is pumping with Symphony pump about every 3 hours and is spoon feeding EBM to infant. Enc mom to hand express post pumping also, mom reports she knows how to hand express.  Enc mom to feed infant STS 8-12 x in 24 hours at first feeding cues.   [name] and BF Resources Handout given, mom informed of IP/OP Services, BF Support Groups and [place] phone #. Enc mom to call out for feeding assistance as needed. Mom has a Medela PIS at home for use. Mom without further questions/concerns at this time. To call out for next feeding.   Maternal Data Formula Feeding for Exclusion: No Has patient been taught Hand Expression?: Yes Does the patient have breastfeeding experience prior to this delivery?: Yes  Feeding Feeding Type: Breast Milk Length of feed: 0 min  LATCH Score                   Interventions    Lactation Tools Discussed/Used Pump Review: Setup, frequency, and cleaning;Milk Storage Initiated by:: Reviewed and encouraged   Consult Status Consult Status: Follow-up Date: 04/01/17 Follow-up type: In-patient    Debby Freiberg [name] 04/01/2017, 12:16 PM

## 2017-04-02 MED ORDER — IBUPROFEN 600 MG PO TABS: 600.0000 mg | ORAL_TABLET | Freq: Four times a day (QID) | ORAL | 2 refills | Status: AC | PRN

## 2017-04-02 MED ORDER — OXYCODONE HCL 5 MG PO TABS: 5.0000 mg | ORAL_TABLET | Freq: Four times a day (QID) | ORAL | 0 refills | Status: AC | PRN

## 2017-04-02 NOTE — Lactation Note (Signed)
This note was copied from a baby's chart. Lactation Consultation Note  Patient Name: Karina Long'H Date: 04/02/2017 Reason for consult: Follow-up assessment;Difficult latch  Baby 75 hours old. Mom reports that baby is latching better and she is re-latching if baby on the tip of the nipple. Parents given curve-tipped syringe with review for supplementation. Enc mom to continue to put baby to breast with cues then supplement with EBM/formula. Discussed increasing supplementation amount to 30 ml for today. Discussed how to continue to increase supplementation amounts. Mom reports that she has personal DEBP at home, so enc taking pumping kit at D/C. Reviewed engorgement prevention/treatment, and mom aware of OP/BFSG and [place] phone line assistance after D/C.   Maternal Data    Feeding Feeding Type: Breast Milk  LATCH Score                   Interventions    Lactation Tools Discussed/Used Tools:  (curve-tipped syringe.)   Consult Status Consult Status: PRN    Karina Long 04/02/2017, 11:22 AM

## 2017-04-02 NOTE — Discharge Summary (Signed)
OB Discharge Summary     Patient Name: Karina Long DOB: April 29, 1976 MRN: [id number]  Date of admission: 03/31/2017 Delivering MD: Ena Dawley   Date of discharge: 04/02/2017  Admitting diagnosis: 8 WEEKS ROM Prior Cesarean Section X 2 Intrauterine pregnancy: [redacted]w[redacted]d     Secondary diagnosis:  Active Problems:   Cesarean delivery delivered  Additional problems: None     Discharge diagnosis: Term Pregnancy Delivered                                                                                                Post partum procedures:None  Augmentation: N/A  Complications: None  Hospital course:  Sceduled C/S   41 y.o. yo [id number] at [redacted]w[redacted]d was admitted to the hospital 03/31/2017 for scheduled cesarean section with the following indication:Elective Repeat.  Membrane Rupture Time/Date: 6:00 AM ,03/31/2017   Patient delivered a Viable infant.03/31/2017  Details of operation can be found in separate operative note.  Pateint had an uncomplicated postpartum course.  She is ambulating, tolerating a regular diet, passing flatus, and urinating well. Patient is discharged home in stable condition on  04/02/17         Physical exam  Vitals:   03/31/17 2220 04/01/17 0603 04/01/17 0839 04/01/17 1741  BP: 100/67 102/60 117/80 109/70  Pulse: 70 76 85 73  Resp: 16 18 20 18   Temp: 98 F (36.7 C) 98 F (36.7 C) 98.4 F (36.9 C) 98.1 F (36.7 C)  TempSrc: Oral Oral Oral Oral  SpO2:   98%    General: alert, cooperative and no distress  Chest: HRRR, Lungs CTA Abd: Soft, Appropriately Tender, BS x 4 Lochia: appropriate Uterine Fundus: firm at U/-2 Incision: Healing well with no significant drainage DVT Evaluation: No significant calf/ankle edema. Labs: Lab Results  Component Value Date   WBC 10.5 04/01/2017   HGB 10.9 (L) 04/01/2017   HCT 30.8 (L) 04/01/2017   MCV 92.8 04/01/2017   PLT 211 04/01/2017   CMP Latest Ref Rng & Units 03/31/2017  Glucose 65 - 99 mg/dL 88  BUN 6 -  20 mg/dL 8  Creatinine 0.44 - 1.00 mg/dL 0.54  Sodium 135 - 145 mmol/L 136  Potassium 3.5 - 5.1 mmol/L 4.0  Chloride 101 - 111 mmol/L 107  CO2 22 - 32 mmol/L 20(L)  Calcium 8.9 - 10.3 mg/dL 9.2  Total Protein 6.5 - 8.1 g/dL 6.1(L)  Total Bilirubin 0.3 - 1.2 mg/dL 0.5  Alkaline Phos 38 - 126 U/L 132(H)  AST 15 - 41 U/L 20  ALT 14 - 54 U/L 15    Discharge instruction: per After Visit Summary and "Baby and Me Booklet". Pain Management, Peri-Care, Breastfeeding, Who and When to call for postpartum complications. Information Sheet(s) given Oral Contraceptions, Iron rich diet   Incision care guidelines: how to clean, when to call, and anticipated healing.   After visit meds:    Diet: routine diet  Activity: Advance as tolerated. Pelvic rest for 6 weeks.   Outpatient follow up:6 weeks Follow up Appt:No future appointments. Follow up Visit:No Follow-up on file.  Postpartum contraception: Progesterone  only pills  Newborn Data: Live born female  Birth Weight: 6 lb 3.3 oz (2815 g) APGAR: 9, 9  Baby Feeding: Bottle and Breast Disposition:home with mother   04/02/2017 Karina Long, CNM

## 2017-04-02 NOTE — Discharge Instructions (Signed)
Oral Contraception Information Oral contraceptive pills (OCPs) are medicines taken to prevent pregnancy. OCPs work by preventing the ovaries from releasing eggs. The hormones in OCPs also cause the cervical mucus to thicken, preventing the sperm from entering the uterus. The hormones also cause the uterine lining to become thin, not allowing a fertilized egg to attach to the inside of the uterus. OCPs are highly effective when taken exactly as prescribed. However, OCPs do not prevent sexually transmitted diseases (STDs). Safe sex practices, such as using condoms along with the pill, can help prevent STDs. Before taking the pill, you may have a physical exam and Pap test. Your health care provider may order blood tests. The health care provider will make sure you are a good candidate for oral contraception. Discuss with your health care provider the possible side effects of the OCP you may be prescribed. When starting an OCP, it can take 2 to 3 months for the body to adjust to the changes in hormone levels in your body. Types of oral contraception  The combination pill--This pill contains estrogen and progestin (synthetic progesterone) hormones. The combination pill comes in 21-day, 28-day, or 91-day packs. Some types of combination pills are meant to be taken continuously (365-day pills). With 21-day packs, you do not take pills for 7 days after the last pill. With 28-day packs, the pill is taken every day. The last 7 pills are without hormones. Certain types of pills have more than 21 hormone-containing pills. With 91-day packs, the first 84 pills contain both hormones, and the last 7 pills contain no hormones or contain estrogen only.  The minipill--This pill contains the progesterone hormone only. The pill is taken every day continuously. It is very important to take the pill at the same time each day. The minipill comes in packs of 28 pills. All 28 pills contain the hormone. Advantages of oral  contraceptive pills  Decreases premenstrual symptoms.  Treats menstrual period cramps.  Regulates the menstrual cycle.  Decreases a heavy menstrual flow.  May treatacne, depending on the type of pill.  Treats abnormal uterine bleeding.  Treats polycystic ovarian syndrome.  Treats endometriosis.  Can be used as emergency contraception. Things that can make oral contraceptive pills less effective OCPs can be less effective if:  You forget to take the pill at the same time every day.  You have a stomach or intestinal disease that lessens the absorption of the pill.  You take OCPs with other medicines that make OCPs less effective, such as antibiotics, certain HIV medicines, and some seizure medicines.  You take expired OCPs.  You forget to restart the pill on day 7, when using the packs of 21 pills.  Risks associated with oral contraceptive pills Oral contraceptive pills can sometimes cause side effects, such as:  Headache.  Nausea.  Breast tenderness.  Irregular bleeding or spotting.  Combination pills are also associated with a small increased risk of:  Blood clots.  Heart attack.  Stroke.  This information is not intended to replace advice given to you by your health care provider. Make sure you discuss any questions you have with your health care provider. Document Released: 09/27/2002 Document Revised: 12/13/2015 Document Reviewed: 12/26/2012 Elsevier Interactive Patient Education  2018 [employer]. Iron-Rich Diet Iron is a mineral that helps your body to produce hemoglobin. Hemoglobin is a protein in your red blood cells that carries oxygen to your body's tissues. Eating too little iron may cause you to feel weak and tired, and  it can increase your risk for infection. Eating enough iron is necessary for your body's metabolism, muscle function, and nervous system. Iron is naturally found in many foods. It can also be added to foods or fortified in foods.  There are two types of dietary iron:  Heme iron. Heme iron is absorbed by the body more easily than nonheme iron. Heme iron is found in meat, poultry, and fish.  Nonheme iron. Nonheme iron is found in dietary supplements, iron-fortified grains, beans, and vegetables.  You may need to follow an iron-rich diet if:  You have been diagnosed with iron deficiency or iron-deficiency anemia.  You have a condition that prevents you from absorbing dietary iron, such as: ? Infection in your intestines. ? Celiac disease. This involves long-lasting (chronic) inflammation of your intestines.  You do not eat enough iron.  You eat a diet that is high in foods that impair iron absorption.  You have lost a lot of blood.  You have heavy bleeding during your menstrual cycle.  You are pregnant.  What is my plan? Your health care provider may help you to determine how much iron you need per day based on your condition. Generally, when a person consumes sufficient amounts of iron in the diet, the following iron needs are met:  Men. ? 53-86 years old: 11 mg per day. ? 65-3 years old: 8 mg per day.  Women. ? 57-41 years old: 15 mg per day. ? 38-20 years old: 18 mg per day. ? Over 69 years old: 8 mg per day. ? Pregnant women: 27 mg per day. ? Breastfeeding women: 9 mg per day.  What do I need to know about an iron-rich diet?  Eat fresh fruits and vegetables that are high in vitamin C along with foods that are high in iron. This will help increase the amount of iron that your body absorbs from food, especially with foods containing nonheme iron. Foods that are high in vitamin C include oranges, peppers, tomatoes, and mango.  Take iron supplements only as directed by your health care provider. Overdose of iron can be life-threatening. If you were prescribed iron supplements, take them with orange juice or a vitamin C supplement.  Cook foods in pots and pans that are made from iron.  Eat nonheme  iron-containing foods alongside foods that are high in heme iron. This helps to improve your iron absorption.  Certain foods and drinks contain compounds that impair iron absorption. Avoid eating these foods in the same meal as iron-rich foods or with iron supplements. These include: ? Coffee, black tea, and red wine. ? Milk, dairy products, and foods that are high in calcium. ? Beans, soybeans, and peas. ? Whole grains.  When eating foods that contain both nonheme iron and compounds that impair iron absorption, follow these tips to absorb iron better. ? Soak beans overnight before cooking. ? Soak whole grains overnight and drain them before using. ? Ferment flours before baking, such as using yeast in bread dough. What foods can I eat? Grains Iron-fortified breakfast cereal. Iron-fortified whole-wheat bread. Enriched rice. Sprouted grains. Vegetables Spinach. Potatoes with skin. Green peas. Broccoli. Red and green bell peppers. Fermented vegetables. Fruits Prunes. Raisins. Oranges. Strawberries. Mango. Grapefruit. Meats and Other Protein Sources Beef liver. Oysters. Beef. Shrimp. [place]. Chicken. [place]. Sardines. Chickpeas. Nuts. Tofu. Beverages Tomato juice. Fresh orange juice. Prune juice. Hibiscus tea. Fortified instant breakfast shakes. Condiments Tahini. Fermented soy sauce. Sweets and Desserts Black-strap molasses. Other Wheat germ. The items  listed above may not be a complete list of recommended foods or beverages. Contact your dietitian for more options. What foods are not recommended? Grains Whole grains. Bran cereal. Bran flour. Oats. Vegetables Artichokes. Brussels sprouts. Kale. Fruits Blueberries. Raspberries. Strawberries. Figs. Meats and Other Protein Sources Soybeans. Products made from soy protein. Dairy Milk. Cream. Cheese. Yogurt. Cottage cheese. Beverages Coffee. Black tea. Red wine. Sweets and Desserts Cocoa. Chocolate. Ice cream. Other Basil.  Oregano. Parsley. The items listed above may not be a complete list of foods and beverages to avoid. Contact your dietitian for more information. This information is not intended to replace advice given to you by your health care provider. Make sure you discuss any questions you have with your health care provider. Document Released: 02/18/2005 Document Revised: 01/25/2016 Document Reviewed: 02/01/2014 Elsevier Interactive Patient Education  [employer].

## 2017-04-03 ENCOUNTER — Encounter (HOSPITAL_COMMUNITY)
Admission: RE | Admit: 2017-04-03 | Discharge: 2017-04-03 | Disposition: A | Discharge status: Home / Self Care | Payer: 59 | Source: Ambulatory Visit

## 2017-09-07 IMAGING — US US MFM OB COMP +14 WKS
1 series · 14 of 28 positions shown · non-contrast
Comparison: none

[Series 1: us mfm ob comp +14 wks · 14 of 77 slices shown]
[im 3/77]
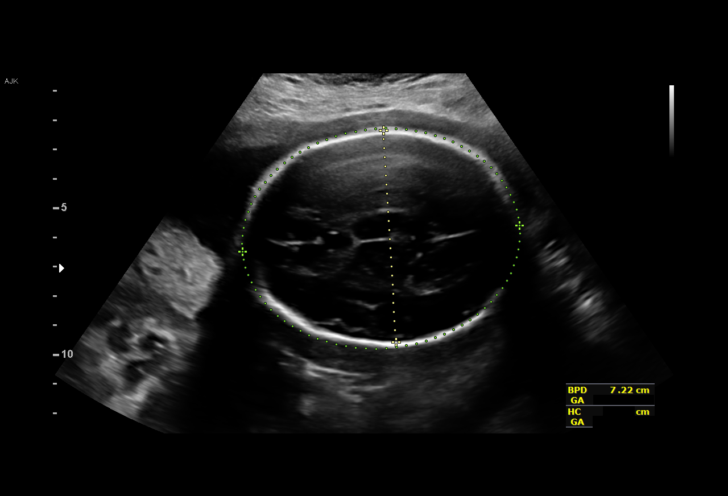
[im 9/77]
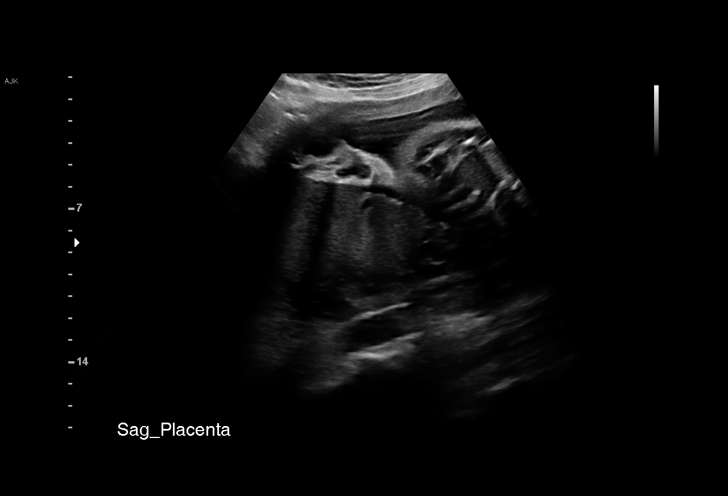
[im 15/77]
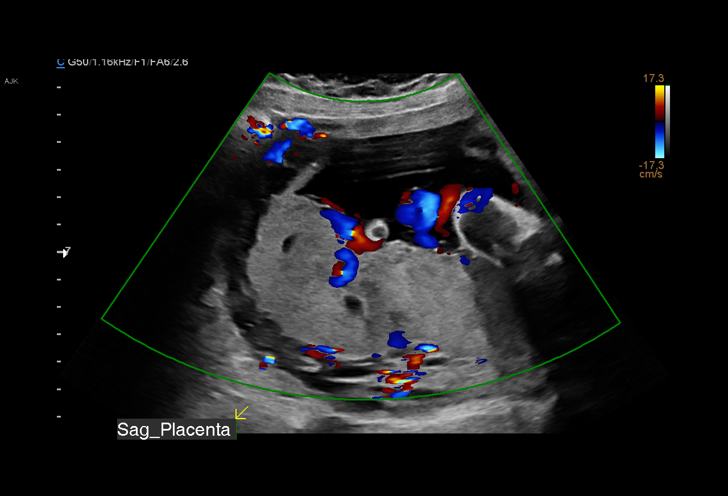
[im 20/77]
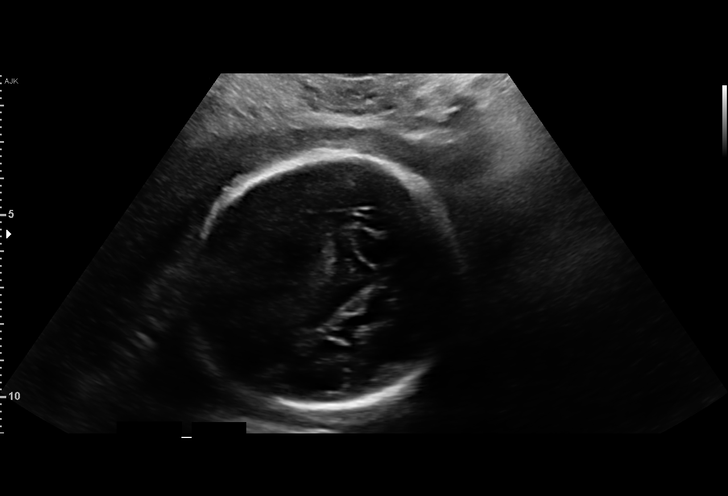
[im 26/77]
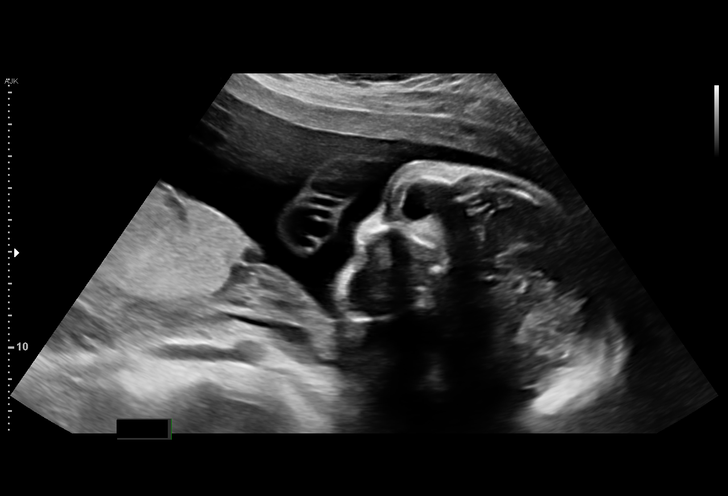
[im 31/77]
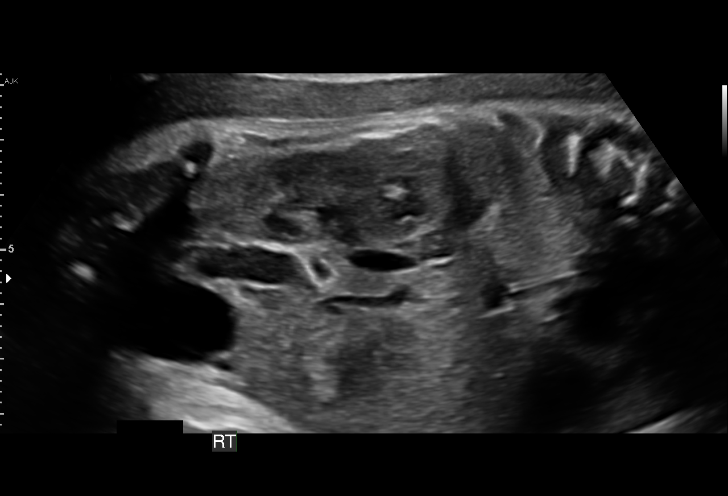
[im 37/77]
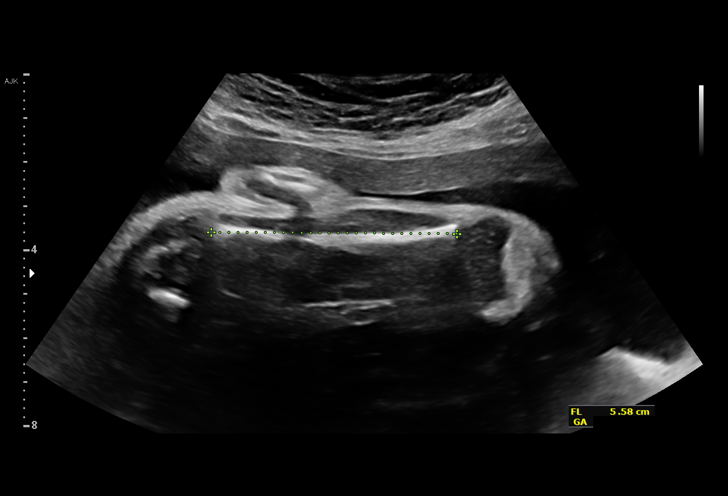
[im 43/77]
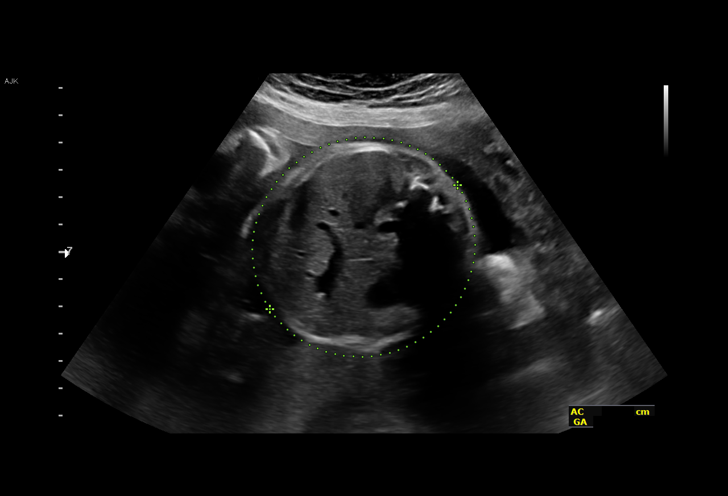
[im 48/77]
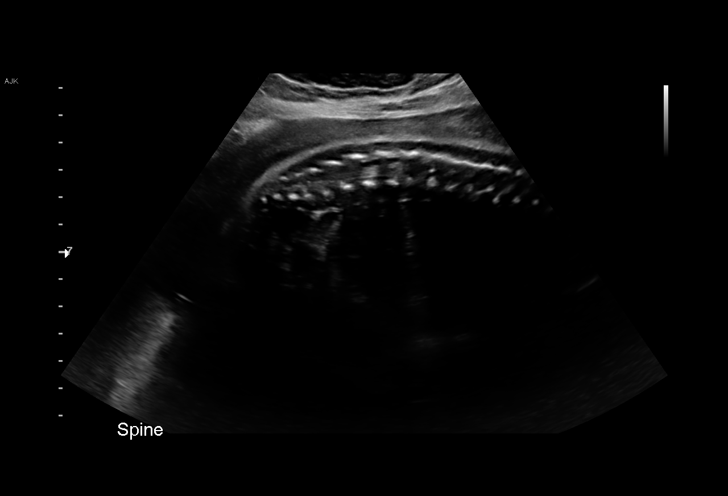
[im 54/77]
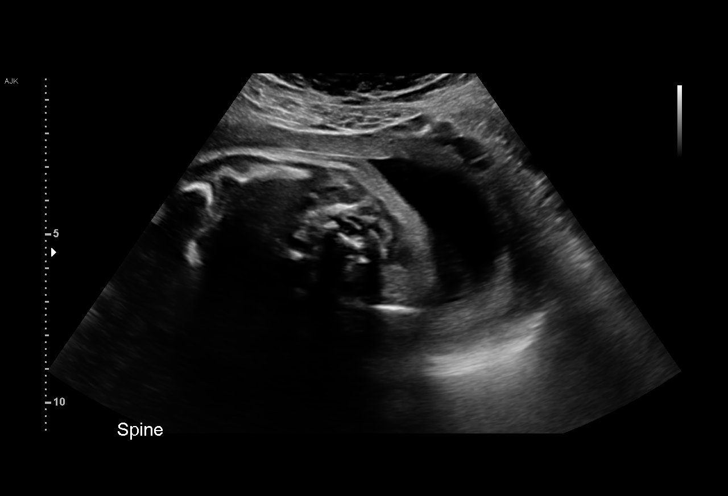
[im 60/77]
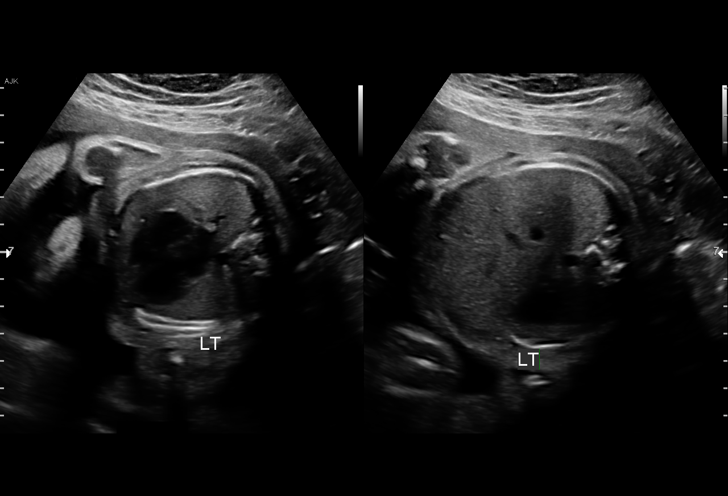
[im 65/77]
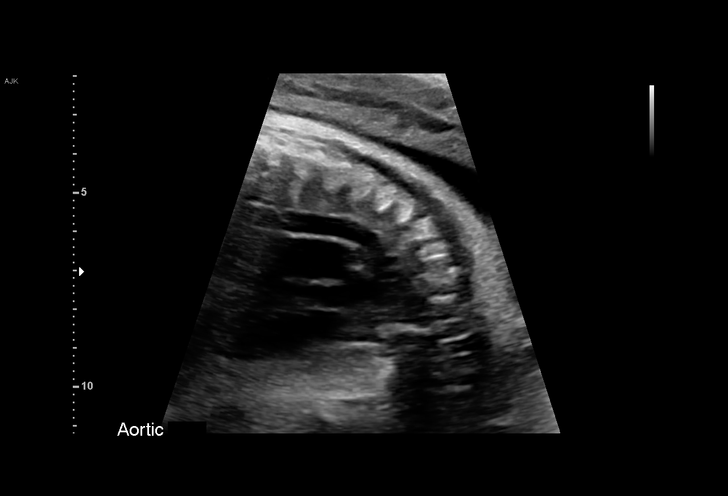
[im 71/77]
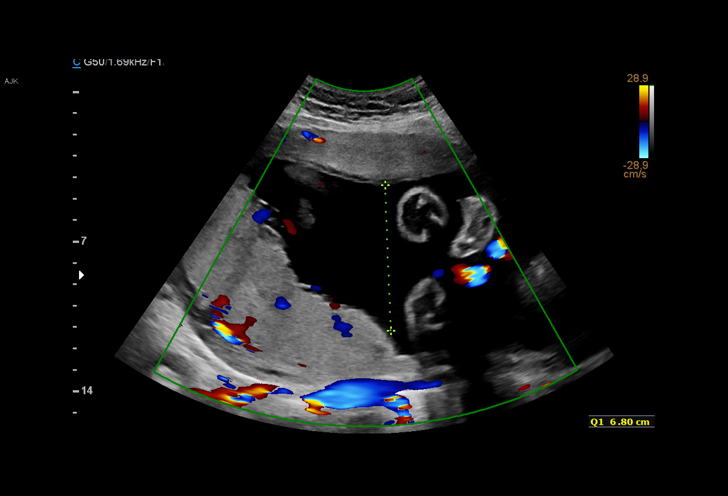
[im 77/77]
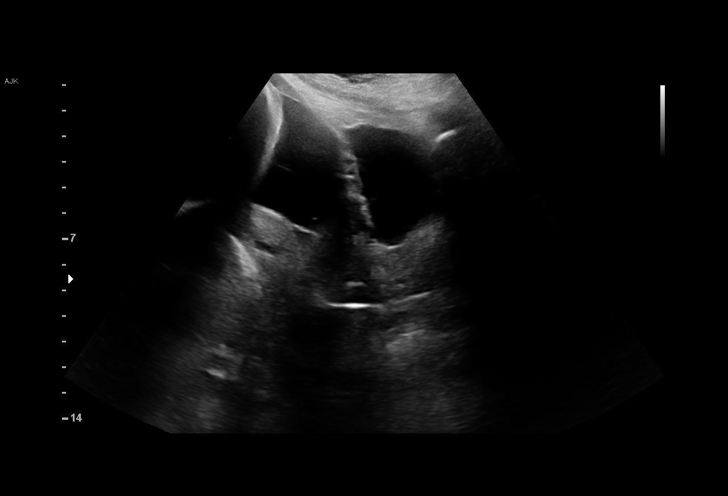

[14 of 28 positions shown; findings below may reference images not displayed]

Obstetrics &
Gynecology
[address]
[place].

1  KAKI JIM            [id number]      [id number]     [id number]
Indications

29 weeks gestation of pregnancy
Encounter for fetal anatomic survey
Vaginal bleeding in pregnancy, third
trimester (has stopped)
History of cesarean delivery, currently
pregnant (x2)
Advanced maternal age multigravida 35+,
third trimester
OB History

Gravidity:    4         Term:   3        Prem:   0        SAB:   0
TOP:          0       Ectopic:  0        Living: 3
Fetal Evaluation

Num Of Fetuses:     1
Fetal Heart         148
Rate(bpm):
Cardiac Activity:   Observed
Presentation:       Cephalic
Placenta:           Posterior, above cervical os
P. Cord Insertion:  Visualized, central

Amniotic Fluid
AFI FV:      Subjectively within normal limits

AFI Sum(cm)     %Tile       Largest Pocket(cm)
18.56           71
RUQ(cm)       RLQ(cm)       LUQ(cm)        LLQ(cm)
6.8
Biometry

BPD:      72.1  mm     G. Age:  29w 0d         14  %    CI:        72.33   %   70 - 86
FL/HC:      20.5   %   19.2 -
HC:      269.7  mm     G. Age:  29w 3d          9  %    HC/AC:      1.07       0.99 -
AC:      251.1  mm     G. Age:  29w 2d         29  %    FL/BPD:     76.8   %   71 - 87
FL:       55.4  mm     G. Age:  29w 1d         19  %    FL/AC:      22.1   %   20 - 24

Est. FW:    2364  gm          3 lb      40  %
Gestational Age

LMP:           29w 6d       Date:   07/05/16                 EDD:   04/11/17
U/S Today:     29w 2d                                        EDD:   04/15/17
Best:          29w 6d    Det. By:   LMP  (07/05/16)          EDD:   04/11/17
Anatomy

Cranium:               Appears normal         Aortic Arch:            Appears normal
Cavum:                 Appears normal         Ductal Arch:            Appears normal
Ventricles:            Appears normal         Diaphragm:              Not well visualized
Choroid Plexus:        Appears normal         Stomach:                Appears normal, left
sided
Cerebellum:            Appears normal         Abdomen:                Appears normal
Posterior Fossa:       Appears normal         Abdominal Wall:         Not well visualized
Nuchal Fold:           Not applicable (>20    Cord Vessels:           Appears normal (3
wks GA)                                        vessel cord)
Face:                  Orbits nl; profile not Kidneys:                Appear normal
well visualized
Lips:                  Appears normal         Bladder:                Appears normal
Thoracic:              Appears normal         Spine:                  Appears normal
Heart:                 Appears normal         Upper Extremities:      Not well visualized
(4CH, axis, and
situs)
RVOT:                  Appears normal         Lower Extremities:      Appears normal
LVOT:                  Appears normal

Other:  Fetus appears to be a male. Technically difficult due to advanced GA
and fetal position.
Cervix Uterus Adnexa

Cervix
Length:              5  cm.
Normal appearance by transabdominal scan.

Uterus
No abnormality visualized.

Left Ovary
Not visualized.

Right Ovary
Not visualized.

Adnexa:       No abnormality visualized.
Impression

SIUP at 29+6 weeks
Cephalic presentation
Normal detailed fetal anatomy; limited views of profile,
diaphragm, CI and upper extremities
Normal amniotic fluid volume
Measurements consistent with LMP dating; EFW at the 40th
%tile
Posterior placenta; no previa; no subchorionic fluid
collections/hemorrhage identified
Recommendations

Follow-up as clinically indicated

## 2021-05-11 ENCOUNTER — Encounter: Payer: Self-pay | Admitting: Emergency Medicine

## 2021-05-11 ENCOUNTER — Other Ambulatory Visit: Payer: Self-pay

## 2021-05-11 ENCOUNTER — Ambulatory Visit
Admission: EM | Admit: 2021-05-11 | Discharge: 2021-05-11 | Disposition: A | Discharge status: Home / Self Care | Payer: BC Managed Care – PPO | Attending: Internal Medicine | Admitting: Internal Medicine

## 2021-05-11 DIAGNOSIS — L03012 Cellulitis of left finger: Secondary | ICD-10-CM | POA: Diagnosis not present

## 2021-05-11 MED ORDER — CEPHALEXIN 500 MG PO CAPS: 500.0000 mg | ORAL_CAPSULE | Freq: Four times a day (QID) | ORAL | 0 refills | Status: AC

## 2021-05-11 NOTE — Discharge Instructions (Signed)
Your finger infection is being treated with cephalexin antibiotic.  Please continue warm Epson salt soaks.  Follow-up if symptoms persist or worsen.

## 2021-05-11 NOTE — ED Triage Notes (Signed)
Left middle finger abscess next to nail bed appearing three days prior. Has had hx of same, was drained in past.

## 2021-05-11 NOTE — ED Provider Notes (Signed)
[facility]    CSN: [id number] Arrival date & time: 05/11/21  1030      History   Chief Complaint Chief Complaint  Patient presents with   Finger Injury    HPI Karina Long is a 45 y.o. female.   Patient presents with left middle finger swelling and pain that has been present for approximately 3 days.  Denies any fever, chills, body aches.  Patient reports that this has occurred before.  Denies any drainage from the finger.  Denies any numbness or tingling.  Denies any injuries to the finger.    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   Hx of post-sterilization tuboplasty 09/17/2016    Past Surgical History:  Procedure Laterality Date   CESAREAN SECTION     CESAREAN SECTION N/A 03/31/2017   Procedure: REPEAT CESAREAN SECTION;  Surgeon: Ena Dawley, MD;  Location: [facility];  Service: Obstetrics;  Laterality: N/A;  Provider requests RNFA, Please   CHOLECYSTECTOMY     LAPAROSCOPY     tubal reversal      OB History     Gravida  6   Para  4   Term  4   Preterm      AB  2   Living  3      SAB  2   IAB      Ectopic      Multiple  0   Live Births  3            Home Medications    Prior to Admission medications   Medication Sig Start Date End Date Taking? Authorizing Provider  cephALEXin (KEFLEX) 500 MG capsule Take 1 capsule (500 mg total) by mouth 4 (four) times daily. 05/11/21  Yes Zymiere Trostle, Hildred Alamin E, FNP  ibuprofen (ADVIL,MOTRIN) 600 MG tablet Take 1 tablet (600 mg total) by mouth every 6 (six) hours as needed. 04/02/17   Gavin Pound, CNM  oxyCODONE (OXY IR/ROXICODONE) 5 MG immediate release  tablet Take 1 tablet (5 mg total) by mouth every 6 (six) hours as needed for moderate pain. 04/02/17   Gavin Pound, CNM    Family History History reviewed. No pertinent family history.  Social History Social History   Tobacco Use   Smoking status: Never   Smokeless tobacco: Never  Substance Use Topics   Alcohol use: No   Drug use: No     Allergies   Patient has no known allergies.   Review of Systems Review of Systems Per HPI  Physical Exam Triage Vital Signs ED Triage Vitals [05/11/21 1139]  Enc Vitals Group     BP (!) 155/107     Pulse Rate 89     Resp 16     Temp 98.3 F (36.8 C)     Temp Source Oral     SpO2 97 %     Weight      Height      Head Circumference      Peak Flow      Pain Score 7     Pain Loc      Pain Edu?      Excl. in [place]?  No data found.  Updated Vital Signs BP (!) 155/107 (BP Location: Left Arm)   Pulse 89   Temp 98.3 F (36.8 C) (Oral)   Resp 16   SpO2 97%   Visual Acuity Right Eye Distance:   Left Eye Distance:   Bilateral Distance:    Right Eye Near:   Left Eye Near:    Bilateral Near:     Physical Exam Constitutional:      General: She is not in acute distress.    Appearance: Normal appearance. She is not toxic-appearing or diaphoretic.  HENT:     Head: Normocephalic and atraumatic.  Eyes:     Extraocular Movements: Extraocular movements intact.     Conjunctiva/sclera: Conjunctivae normal.  Pulmonary:     Effort: Pulmonary effort is normal.  Skin:    General: Skin is warm and dry.     Findings: Abscess present.     Comments: Mild swelling and redness present to left middle finger to the pad of the finger that extends the skin meets the nailbed.  Neurovascular intact.  Neurological:     General: No focal deficit present.     Mental Status: She is alert and oriented to person, place, and time. Mental status is at baseline.  Psychiatric:        Mood and Affect: Mood normal.        Behavior: Behavior normal.         Thought Content: Thought content normal.        Judgment: Judgment normal.     UC Treatments / Results  Labs (all labs ordered are listed, but only abnormal results are displayed) Labs Reviewed - No data to display  EKG   Radiology No results found.  Procedures Procedures (including critical care time)  Medications Ordered in UC Medications - No data to display  Initial Impression / Assessment and Plan / UC Course  I have reviewed the triage vital signs and the nursing notes.  Pertinent labs & imaging results that were available during my care of the patient were reviewed by me and considered in my medical decision making (see chart for details).     I&D not performed at this time as there is no area to drain abscess.  Will treat with cephalexin x7 days.  Patient to continue warm Epson salt soaks.  No red flags seen on exam and patient is neurovascularly intact. Discussed strict return precautions. Patient verbalized understanding and is agreeable with plan.  Final Clinical Impressions(s) / UC Diagnoses   Final diagnoses:  Paronychia of finger of left hand     Discharge Instructions      Your finger infection is being treated with cephalexin antibiotic.  Please continue warm Epson salt soaks.  Follow-up if symptoms persist or worsen.     ED Prescriptions     Medication Sig Dispense Auth. Provider   cephALEXin (KEFLEX) 500 MG capsule Take 1 capsule (500 mg total) by mouth 4 (four) times daily. 28 capsule [place], Michele Rockers, [place]      PDMP not reviewed this encounter.   Teodora Medici, [place] 05/11/21 1233

## 2022-07-28 ENCOUNTER — Ambulatory Visit
Admission: EM | Admit: 2022-07-28 | Discharge: 2022-07-28 | Disposition: A | Discharge status: Home / Self Care | Payer: BC Managed Care – PPO | Attending: Internal Medicine | Admitting: Internal Medicine

## 2022-07-28 DIAGNOSIS — R509 Fever, unspecified: Secondary | ICD-10-CM

## 2022-07-28 DIAGNOSIS — J101 Influenza due to other identified influenza virus with other respiratory manifestations: Secondary | ICD-10-CM

## 2022-07-28 LAB — POCT INFLUENZA A/B
Influenza A, POC: POSITIVE — AB
Influenza B, POC: NEGATIVE

## 2022-07-28 MED ORDER — OSELTAMIVIR PHOSPHATE 75 MG PO CAPS: 75.0000 mg | ORAL_CAPSULE | Freq: Two times a day (BID) | ORAL | 0 refills | Status: AC

## 2022-07-28 MED ORDER — IBUPROFEN 800 MG PO TABS
800.0000 mg | ORAL_TABLET | Freq: Once | ORAL | Status: AC
  Administered 2022-07-28: 800 mg via ORAL

## 2022-07-28 NOTE — ED Triage Notes (Signed)
Pt c/o cough, fever, body aches onset ~ 3 days ago   Fever on triage last tylenol ~ 4 hours ago

## 2022-07-28 NOTE — Discharge Instructions (Signed)
You have flu A which is being treated with Tamiflu.  Please increase water intake and rest.  Recommend fever monitoring and management as we discussed.  Follow-up if any symptoms persist or worsen.

## 2022-07-28 NOTE — ED Provider Notes (Signed)
[facility]    CSN: [id number] Arrival date & time: 07/28/22  1504      History   Chief Complaint Chief Complaint  Patient presents with   Cough    HPI Karina Long is a 47 y.o. female.   Patient presents with 2 to 3-day history of cough, fever, sore throat, generalized body aches, runny nose.  Her son has similar symptoms.  Reports that her son tested negative for COVID-19.  Patient denies chest pain, shortness of breath nausea, vomiting, diarrhea, abdominal pain.  Patient took Tylenol approximately 4 hours ago.  Denies history of asthma.   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   Hx of post-sterilization tuboplasty 09/17/2016    Past Surgical History:  Procedure Laterality Date   CESAREAN SECTION     CESAREAN SECTION N/A 03/31/2017   Procedure: REPEAT CESAREAN SECTION;  Surgeon: Ena Dawley, MD;  Location: [place];  Service: Obstetrics;  Laterality: N/A;  Provider requests RNFA, Please   CHOLECYSTECTOMY     LAPAROSCOPY     tubal reversal      OB History     Gravida  6   Para  4   Term  4   Preterm      AB  2   Living  3      SAB  2   IAB      Ectopic      Multiple  0   Live Births  3            Home Medications    Prior to Admission medications   Medication Sig Start Date End Date Taking? Authorizing Provider  oseltamivir (TAMIFLU) 75 MG capsule Take 1 capsule (75 mg total) by mouth every 12 (twelve) hours. 07/28/22  Yes Janeane Cozart, Hildred Alamin E, FNP  cephALEXin (KEFLEX) 500 MG capsule Take 1 capsule (500 mg total) by mouth 4 (four) times daily. 05/11/21   Teodora Medici, FNP  ibuprofen  (ADVIL,MOTRIN) 600 MG tablet Take 1 tablet (600 mg total) by mouth every 6 (six) hours as needed. 04/02/17   Gavin Pound, CNM  oxyCODONE (OXY IR/ROXICODONE) 5 MG immediate release tablet Take 1 tablet (5 mg total) by mouth every 6 (six) hours as needed for moderate pain. 04/02/17   Gavin Pound, CNM    Family History History reviewed. No pertinent family history.  Social History Social History   Tobacco Use   Smoking status: Never   Smokeless tobacco: Never  Substance Use Topics   Alcohol use: No   Drug use: No     Allergies   Patient has no known allergies.   Review of Systems Review of Systems Per HPI  Physical Exam Triage Vital Signs ED Triage Vitals [07/28/22 1612]  Enc Vitals Group     BP (!) 133/90     Pulse Rate (!) 136     Resp 18     Temp (!) 102.1 F (38.9 C)     Temp Source Oral     SpO2 95 %     Weight      Height      Head Circumference  Peak Flow      Pain Score 7     Pain Loc      Pain Edu?      Excl. in [place]?    No data found.  Updated Vital Signs BP (!) 133/90 (BP Location: Left Arm)   Pulse (!) 105   Temp 100.3 F (37.9 C)   Resp 18   SpO2 95%   Breastfeeding No   Visual Acuity Right Eye Distance:   Left Eye Distance:   Bilateral Distance:    Right Eye Near:   Left Eye Near:    Bilateral Near:     Physical Exam Constitutional:      General: She is not in acute distress.    Appearance: Normal appearance. She is not toxic-appearing or diaphoretic.  HENT:     Head: Normocephalic and atraumatic.     Right Ear: Tympanic membrane and ear canal normal.     Left Ear: Tympanic membrane and ear canal normal.     Nose: Congestion present.     Mouth/Throat:     Mouth: Mucous membranes are moist.     Pharynx: No pharyngeal swelling, oropharyngeal exudate, posterior oropharyngeal erythema or uvula swelling.     Tonsils: No tonsillar exudate or tonsillar abscesses.  Eyes:     Extraocular Movements: Extraocular movements intact.      Conjunctiva/sclera: Conjunctivae normal.     Pupils: Pupils are equal, round, and reactive to light.  Cardiovascular:     Rate and Rhythm: Regular rhythm. Tachycardia present.     Pulses: Normal pulses.     Heart sounds: Normal heart sounds.  Pulmonary:     Effort: Pulmonary effort is normal. No respiratory distress.     Breath sounds: Normal breath sounds. No stridor. No wheezing, rhonchi or rales.  Abdominal:     General: Abdomen is flat. Bowel sounds are normal.     Palpations: Abdomen is soft.  Musculoskeletal:        General: Normal range of motion.     Cervical back: Normal range of motion.  Skin:    General: Skin is warm and dry.  Neurological:     General: No focal deficit present.     Mental Status: She is alert and oriented to person, place, and time. Mental status is at baseline.  Psychiatric:        Mood and Affect: Mood normal.        Behavior: Behavior normal.      UC Treatments / Results  Labs (all labs ordered are listed, but only abnormal results are displayed) Labs Reviewed  POCT INFLUENZA A/B - Abnormal; Notable for the following components:      Result Value   Influenza A, POC Positive (*)    All other components within normal limits    EKG   Radiology No results found.  Procedures Procedures (including critical care time)  Medications Ordered in UC Medications  ibuprofen (ADVIL) tablet 800 mg (800 mg Oral Given 07/28/22 1625)    Initial Impression / Assessment and Plan / UC Course  I have reviewed the triage vital signs and the nursing notes.  Pertinent labs & imaging results that were available during my care of the patient were reviewed by me and considered in my medical decision making (see chart for details).     Patient tested positive for influenza A.  Will treat with Tamiflu.  Ibuprofen administered in urgent care to decreased heart rate and temp.  They both subsequently decreased  with ibuprofen administration.  Therefore,  tachycardia was most likely related to fever and no further workup for this is necessary.  Patient advised of supportive care and symptom management as well as increasing fluids and rest.  Discussed return precautions.  Patient verbalized understanding and was agreeable with plan. Final Clinical Impressions(s) / UC Diagnoses   Final diagnoses:  Influenza A  Fever, unspecified     Discharge Instructions      You have flu A which is being treated with Tamiflu.  Please increase water intake and rest.  Recommend fever monitoring and management as we discussed.  Follow-up if any symptoms persist or worsen.    ED Prescriptions     Medication Sig Dispense Auth. Provider   oseltamivir (TAMIFLU) 75 MG capsule Take 1 capsule (75 mg total) by mouth every 12 (twelve) hours. 10 capsule Teodora Medici, [place]      PDMP not reviewed this encounter.   Teodora Medici, [place] 07/28/22 [id number]
